# Patient Record
Sex: Female | Born: 1970 | Race: Black or African American | Hispanic: No | Marital: Single | Smoking: Current every day smoker
Health system: Southern US, Community
[De-identification: ages and names within clinical notes are randomized; demographics above are authoritative.]

## PROBLEM LIST (undated history)

## (undated) DIAGNOSIS — I10 Essential (primary) hypertension: Secondary | ICD-10-CM

## (undated) HISTORY — PX: TUBAL LIGATION: SHX77

## (undated) HISTORY — PX: KNEE SURGERY: SHX244

---

## 2006-08-14 ENCOUNTER — Emergency Department (HOSPITAL_COMMUNITY): Admission: EM | Admit: 2006-08-14 | Discharge: 2006-08-14 | Payer: Self-pay | Admitting: Emergency Medicine

## 2009-11-27 ENCOUNTER — Emergency Department (HOSPITAL_BASED_OUTPATIENT_CLINIC_OR_DEPARTMENT_OTHER): Admission: EM | Admit: 2009-11-27 | Discharge: 2009-11-27 | Payer: Self-pay | Admitting: Emergency Medicine

## 2009-11-27 ENCOUNTER — Ambulatory Visit: Payer: Self-pay | Admitting: Interventional Radiology

## 2010-11-02 ENCOUNTER — Emergency Department (HOSPITAL_BASED_OUTPATIENT_CLINIC_OR_DEPARTMENT_OTHER)
Admission: EM | Admit: 2010-11-02 | Discharge: 2010-11-02 | Disposition: A | Discharge status: Home / Self Care | Payer: Medicaid Other | Attending: Emergency Medicine | Admitting: Emergency Medicine

## 2010-11-02 DIAGNOSIS — H00019 Hordeolum externum unspecified eye, unspecified eyelid: Secondary | ICD-10-CM | POA: Insufficient documentation

## 2010-11-02 DIAGNOSIS — H5710 Ocular pain, unspecified eye: Secondary | ICD-10-CM | POA: Insufficient documentation

## 2010-11-02 DIAGNOSIS — H5789 Other specified disorders of eye and adnexa: Secondary | ICD-10-CM | POA: Insufficient documentation

## 2010-11-02 DIAGNOSIS — L03211 Cellulitis of face: Secondary | ICD-10-CM | POA: Insufficient documentation

## 2010-11-02 DIAGNOSIS — L0201 Cutaneous abscess of face: Secondary | ICD-10-CM | POA: Insufficient documentation

## 2010-12-04 LAB — PREGNANCY, URINE: Preg Test, Ur: NEGATIVE

## 2011-12-13 ENCOUNTER — Other Ambulatory Visit: Payer: Self-pay

## 2011-12-13 ENCOUNTER — Emergency Department (HOSPITAL_COMMUNITY): Payer: Medicaid Other

## 2011-12-13 ENCOUNTER — Emergency Department (HOSPITAL_COMMUNITY)
Admission: EM | Admit: 2011-12-13 | Discharge: 2011-12-13 | Disposition: A | Discharge status: Home / Self Care | Payer: Medicaid Other | Attending: Emergency Medicine | Admitting: Emergency Medicine

## 2011-12-13 ENCOUNTER — Encounter (HOSPITAL_COMMUNITY): Payer: Self-pay | Admitting: Emergency Medicine

## 2011-12-13 DIAGNOSIS — R059 Cough, unspecified: Secondary | ICD-10-CM | POA: Insufficient documentation

## 2011-12-13 DIAGNOSIS — R0789 Other chest pain: Secondary | ICD-10-CM

## 2011-12-13 DIAGNOSIS — R05 Cough: Secondary | ICD-10-CM | POA: Insufficient documentation

## 2011-12-13 DIAGNOSIS — R071 Chest pain on breathing: Secondary | ICD-10-CM | POA: Insufficient documentation

## 2011-12-13 MED ORDER — PROMETHAZINE-DM 6.25-15 MG/5ML PO SYRP: 5.0000 mL | ORAL_SOLUTION | Freq: Four times a day (QID) | ORAL | Status: AC | PRN

## 2011-12-13 MED ORDER — IBUPROFEN 800 MG PO TABS
800.0000 mg | ORAL_TABLET | Freq: Once | ORAL | Status: AC
  Administered 2011-12-13: 800 mg via ORAL
  Filled 2011-12-13: qty 1

## 2011-12-13 MED ORDER — IBUPROFEN 800 MG PO TABS: 800.0000 mg | ORAL_TABLET | Freq: Once | ORAL | Status: AC

## 2011-12-13 MED ORDER — CYCLOBENZAPRINE HCL 5 MG PO TABS: 5.0000 mg | ORAL_TABLET | Freq: Once | ORAL | Status: AC

## 2011-12-13 MED ORDER — CYCLOBENZAPRINE HCL 10 MG PO TABS
5.0000 mg | ORAL_TABLET | Freq: Once | ORAL | Status: AC
  Administered 2011-12-13: 22:00:00 via ORAL
  Filled 2011-12-13: qty 1

## 2011-12-13 NOTE — ED Notes (Signed)
Lab in for blood draw.

## 2011-12-13 NOTE — ED Notes (Signed)
PT. REPORTS PERSISTENT PRODUCTIVE COUGH WITH RUNNY NOSE/NASAL CONGESTION WITH CHEST CONGESTION , ALSO REPORTS UPPER BACK PAIN AND LATERAL RIBCAGE PAIN WORSE WHEN COUGHING AND DEEP INSPIRATION FOR SEVERAL DAYS .

## 2011-12-13 NOTE — ED Provider Notes (Addendum)
History     CSN: [id number]  Arrival date & time 12/13/11  1908   First MD Initiated Contact with Patient 12/13/11 2157      Chief Complaint  Patient presents with  . Cough    (Consider location/radiation/quality/duration/timing/severity/associated sxs/prior treatment) HPI Comments: Patient with productive cough, runny nose, chest congestion over back pain bilateral rib cage worse with coughing or deep breath.  For the past several days.  She has not taken any medication for relief.  She has not done anything to relieve her running nose.  Nothing makes her symptoms better lying down makes her symptoms worse  The history is provided by the patient.    History reviewed. No pertinent past medical history.  History reviewed. No pertinent past surgical history.  No family history on file.  History  Substance Use Topics  . Smoking status: Never Smoker   . Smokeless tobacco: Not on file  . Alcohol Use: No    OB History    Grav Para Term Preterm Abortions TAB SAB Ect Mult Living                  Review of Systems  Constitutional: Negative for fever and chills.  HENT: Positive for congestion.   Respiratory: Positive for cough. Negative for shortness of breath and wheezing.   Cardiovascular: Positive for chest pain.  Neurological: Negative for dizziness and weakness.    Allergies  Review of patient's allergies indicates no known allergies.  Home Medications   Current Outpatient Rx  Name Route Sig Dispense Refill  . CYCLOBENZAPRINE HCL 5 MG PO TABS Oral Take 1 tablet (5 mg total) by mouth once. 30 tablet 0  . IBUPROFEN 800 MG PO TABS Oral Take 1 tablet (800 mg total) by mouth once. 30 tablet 0  . PROMETHAZINE-DM 6.25-15 MG/5ML PO SYRP Oral Take 5 mLs by mouth 4 (four) times daily as needed for cough. 118 mL 0    BP 108/73  Pulse 57  Temp(Src) 97.9 F (36.6 C) (Oral)  Resp 18  SpO2 100%  LMP 12/10/2011  Physical Exam  Constitutional: She appears well-developed  and well-nourished.  Cardiovascular: Normal rate.   Pulmonary/Chest: Effort normal.  Abdominal: Soft.  Musculoskeletal: Normal range of motion.  Neurological: She is alert.  Skin: Skin is warm.    ED Course  Procedures (including critical care time)   Labs Reviewed  POCT I-STAT TROPONIN I   Dg Chest 2 View  12/13/2011  *RADIOLOGY REPORT*  Clinical Data: Chest pain and shortness of breath.  Previous smoker.  CHEST - 2 VIEW  Comparison: 11/27/2009  Findings: Normal heart size.  Clear lung fields.  No bony abnormality.  The appearance is not significantly changed from 2011.  IMPRESSION: Negative.  Original Report Authenticated By: Elsie Stain, M.D.     1. Cough   2. Chest wall pain    ED ECG REPORT   Date: 12/13/2011  EKG Time: 11:39 PM  Rate65  Rhythm: normal sinus rhythm,  unchanged from previous tracings  Axis: normal  Intervals:normal  ST&T Change: none  Narrative Interpretation:R atrial enlargement              MDM  Chest wall pain due to chronic cough        Arman Filter, NP 12/13/11 4098  Arman Filter, NP 12/13/11 1191  Arman Filter, NP 02/12/12 2105

## 2011-12-13 NOTE — Discharge Instructions (Signed)
Chest Pain (Nonspecific) Chest pain has many causes. Your pain could be caused by something serious, such as a heart attack or a blood clot in the lungs. It could also be caused by something less serious, such as a chest bruise or a virus. Follow up with your doctor. More lab tests or other studies may be needed to find the cause of your pain. Most of the time, nonspecific chest pain will improve within 2 to 3 days of rest and mild pain medicine. HOME CARE  For chest bruises, you may put ice on the sore area for 15 to 20 minutes, 3 to 4 times a day. Do this only if it makes you or your child feel better.   Put ice in a plastic bag.   Place a towel between the skin and the bag.   Rest for the next 2 to 3 days.   Go back to work if the pain improves.   See your doctor if the pain lasts longer than 1 to 2 weeks.   Only take medicine as told by your doctor.   Quit smoking if you smoke.  GET HELP RIGHT AWAY IF:   There is more pain or pain that spreads to the arm, neck, jaw, back, or belly (abdomen).   You or your child has shortness of breath.   You or your child coughs more than usual or coughs up blood.   You or your child has very bad back or belly pain, feels sick to his or her stomach (nauseous), or throws up (vomits).   You or your child has very bad weakness.   You or your child passes out (faints).   You or your child has a temperature by mouth above 102 F (38.9 C), not controlled by medicine.  Any of these problems may be serious and may be an emergency. Do not wait to see if the problems will go away. Get medical help right away. Call your local emergency services 911 in [place] Do not drive yourself to the hospital. MAKE SURE YOU:   Understand these instructions.   Will watch this condition.   Will get help right away if you or your child is not doing well or gets worse.  Document Released: 02/13/2008 Document Revised: 08/16/2011 Document Reviewed:  02/13/2008 [facility] Patient Information 2012 [place], [place].Chest Wall Pain Chest wall pain is pain in or around the bones and muscles of your chest. It may take up to 6 weeks to get better. It may take longer if you must stay physically active in your work and activities.  CAUSES  Chest wall pain may happen on its own. However, it may be caused by:  A viral illness like the flu.   Injury.   Coughing.   Exercise.   Arthritis.   Fibromyalgia.   Shingles.  HOME CARE INSTRUCTIONS   Avoid overtiring physical activity. Try not to strain or perform activities that cause pain. This includes any activities using your chest or your abdominal and side muscles, especially if heavy weights are used.   Put ice on the sore area.   Put ice in a plastic bag.   Place a towel between your skin and the bag.   Leave the ice on for 15 to 20 minutes per hour while awake for the first 2 days.   Only take over-the-counter or prescription medicines for pain, discomfort, or fever as directed by your caregiver.  SEEK IMMEDIATE MEDICAL CARE IF:   Your pain increases,  or you are very uncomfortable.   You have a fever.   Your chest pain becomes worse.   You have new, unexplained symptoms.   You have nausea or vomiting.   You feel sweaty or lightheaded.   You have a cough with phlegm (sputum), or you cough up blood.  MAKE SURE YOU:   Understand these instructions.   Will watch your condition.   Will get help right away if you are not doing well or get worse.  Document Released: 08/27/2005 Document Revised: 08/16/2011 Document Reviewed: 04/23/2011 [facility] Patient Information 2012 [place], [place].Chest Wall Pain Chest wall pain is pain in or around the bones and muscles of your chest. It may take up to 6 weeks to get better. It may take longer if you must stay physically active in your work and activities.  CAUSES  Chest wall pain may happen on its own. However, it may be caused by:  A  viral illness like the flu.   Injury.   Coughing.   Exercise.   Arthritis.   Fibromyalgia.   Shingles.  HOME CARE INSTRUCTIONS   Avoid overtiring physical activity. Try not to strain or perform activities that cause pain. This includes any activities using your chest or your abdominal and side muscles, especially if heavy weights are used.   Put ice on the sore area.   Put ice in a plastic bag.   Place a towel between your skin and the bag.   Leave the ice on for 15 to 20 minutes per hour while awake for the first 2 days.   Only take over-the-counter or prescription medicines for pain, discomfort, or fever as directed by your caregiver.  SEEK IMMEDIATE MEDICAL CARE IF:   Your pain increases, or you are very uncomfortable.   You have a fever.   Your chest pain becomes worse.   You have new, unexplained symptoms.   You have nausea or vomiting.   You feel sweaty or lightheaded.   You have a cough with phlegm (sputum), or you cough up blood.  MAKE SURE YOU:   Understand these instructions.   Will watch your condition.   Will get help right away if you are not doing well or get worse.  Document Released: 08/27/2005 Document Revised: 08/16/2011 Document Reviewed: 04/23/2011 [facility] Patient Information 2012 [place], [place]. RESOURCE GUIDE  Dental Problems  Patients with Medicaid: [facility] [phone]                                           [phone] Phone:  [phone]                                                  Phone:  [phone]  If unable to pay or uninsured, contact:  [facility] or [facility] to become qualified for the adult dental clinic.  Chronic Pain Problems Contact [facility]  [phone] Patients need to be referred by their primary care doctor.  Insufficient Money for Medicine Contact [employer] Way:  call "211" or [facility] [employer]  [phone].  No Primary Care Doctor Call [facility]  [phone] Other agencies that provide inexpensive medical care    [facility]  [phone]    [facility]  [phone]    [facility]  [phone]    [facility]  [phone]    [facility]  [phone]    [facility]  [phone]  Psychological Services [facility]  [phone] [facility]  [phone] [facility]   [phone] (emergency services [phone])  Substance Abuse Resources [facility]  [phone] [facility] [phone] [facility] [phone] Floydene Flock [phone] Residential & Outpatient Substance Abuse Program  [phone]  Abuse/Neglect [facility] Child Abuse Hotline 539-530-7616 [facility] Child Abuse Hotline 5817193654 (After Hours)  Emergency Shelter [facility] [phone]  [facility] [facility] 7095963059 [facility] [phone]  MRSA Hotline #:   586-516-9902    [facility] Resources  [facility] [place]     [employer]                          [facility] Dept. [address] [place]                       [address]      [address]  Blondell Reveal Phone:  [phone]                                   Phone:  [phone]                 Phone:  [phone]  [facility] Phone:  [phone]  [facility] Child Abuse Hotline 279-047-0818 [phone] (After Hours)

## 2011-12-13 NOTE — ED Notes (Signed)
Pt denies any questions or pain upon discharge. 

## 2011-12-14 NOTE — ED Provider Notes (Signed)
Medical screening examination/treatment/procedure(s) were performed by non-physician practitioner and as supervising physician I was immediately available for consultation/collaboration.   Lorece Keach, MD 12/14/11 0036 

## 2012-02-13 NOTE — ED Provider Notes (Signed)
Medical screening examination/treatment/procedure(s) were performed by non-physician practitioner and as supervising physician I was immediately available for consultation/collaboration.   Caeleb Batalla, MD 02/13/12 2231 

## 2012-02-21 ENCOUNTER — Encounter (HOSPITAL_BASED_OUTPATIENT_CLINIC_OR_DEPARTMENT_OTHER): Payer: Self-pay | Admitting: Student

## 2012-02-21 ENCOUNTER — Emergency Department (HOSPITAL_BASED_OUTPATIENT_CLINIC_OR_DEPARTMENT_OTHER)
Admission: EM | Admit: 2012-02-21 | Discharge: 2012-02-21 | Disposition: A | Discharge status: Home / Self Care | Payer: Medicaid Other | Attending: Emergency Medicine | Admitting: Emergency Medicine

## 2012-02-21 DIAGNOSIS — J029 Acute pharyngitis, unspecified: Secondary | ICD-10-CM

## 2012-02-21 DIAGNOSIS — R070 Pain in throat: Secondary | ICD-10-CM | POA: Insufficient documentation

## 2012-02-21 LAB — RAPID STREP SCREEN (MED CTR MEBANE ONLY): Streptococcus, Group A Screen (Direct): NEGATIVE

## 2012-02-21 NOTE — ED Notes (Signed)
Sore throat x 3-4 days, painful swallowing

## 2012-02-21 NOTE — ED Provider Notes (Signed)
History     CSN: [id number]  Arrival date & time 02/21/12  1251   First MD Initiated Contact with Patient 02/21/12 1303      Chief Complaint  Patient presents with  . Sore Throat    (Consider location/radiation/quality/duration/timing/severity/associated sxs/prior treatment) HPI  Patient with sore throat for 3-4 days. She describes pain with swallowing. She denies any swelling. She has not had a fever with this. She has had some cough and is coughing up some yellow sputum. She denies any shortness of breath. She denies any sick contacts. She quit smoking in October. History reviewed. No pertinent past medical history.  Past Surgical History  Procedure Date  . Knee surgery     Left Knee    History reviewed. No pertinent family history.  History  Substance Use Topics  . Smoking status: Never Smoker   . Smokeless tobacco: Not on file  . Alcohol Use: No    OB History    Grav Para Term Preterm Abortions TAB SAB Ect Mult Living                  Review of Systems  All other systems reviewed and are negative.    Allergies  Review of patient's allergies indicates no known allergies.  Home Medications  No current outpatient prescriptions on file.  BP 123/80  Pulse 72  Temp 98 F (36.7 C) (Oral)  Resp 20  Wt 187 lb (84.823 kg)  SpO2 99%  LMP 02/08/2012  Physical Exam  Nursing note and vitals reviewed. Constitutional: She appears well-developed and well-nourished.  HENT:  Head: Normocephalic and atraumatic.  Eyes: Conjunctivae and EOM are normal. Pupils are equal, round, and reactive to light.  Neck: Normal range of motion. Neck supple.  Cardiovascular: Normal rate, regular rhythm, normal heart sounds and intact distal pulses.   Pulmonary/Chest: Effort normal and breath sounds normal.  Abdominal: Soft. Bowel sounds are normal.  Musculoskeletal: Normal range of motion.  Neurological: She is alert.  Skin: Skin is warm and dry.  Psychiatric: She has a normal  mood and affect. Thought content normal.    ED Course  Procedures (including critical care time)   Labs Reviewed  RAPID STREP SCREEN   No results found.   No diagnosis found.    MDM         Hilario Quarry, MD 02/22/12 [phone]

## 2012-02-21 NOTE — Discharge Instructions (Signed)
Antibiotic Nonuse  Your caregiver felt that the infection or problem was not one that would be helped with an antibiotic. Infections may be caused by viruses or bacteria. Only a caregiver can tell which one of these is the likely cause of an illness. A cold is the most common cause of infection in both adults and children. A cold is a virus. Antibiotic treatment will have no effect on a viral infection. Viruses can lead to many lost days of work caring for sick children and many missed days of school. Children may catch as many as 10 "colds" or "flus" per year during which they can be tearful, cranky, and uncomfortable. The goal of treating a virus is aimed at keeping the ill person comfortable. Antibiotics are medications used to help the body fight bacterial infections. There are relatively few types of bacteria that cause infections but there are hundreds of viruses. While both viruses and bacteria cause infection they are very different types of germs. A viral infection will typically go away by itself within 7 to 10 days. Bacterial infections may spread or get worse without antibiotic treatment. Examples of bacterial infections are:  Sore throats (like strep throat or tonsillitis).   Infection in the lung (pneumonia).   Ear and skin infections.  Examples of viral infections are:  Colds or flus.   Most coughs and bronchitis.   Sore throats not caused by Strep.   Runny noses.  It is often best not to take an antibiotic when a viral infection is the cause of the problem. Antibiotics can kill off the helpful bacteria that we have inside our body and allow harmful bacteria to start growing. Antibiotics can cause side effects such as allergies, nausea, and diarrhea without helping to improve the symptoms of the viral infection. Additionally, repeated uses of antibiotics can cause bacteria inside of our body to become resistant. That resistance can be passed onto harmful bacterial. The next time  you have an infection it may be harder to treat if antibiotics are used when they are not needed. Not treating with antibiotics allows our own immune system to develop and take care of infections more efficiently. Also, antibiotics will work better for [place] when they are prescribed for bacterial infections. Treatments for a child that is ill may include:  Give extra fluids throughout the day to stay hydrated.   Get plenty of rest.   Only give your child over-the-counter or prescription medicines for pain, discomfort, or fever as directed by your caregiver.   The use of a cool mist humidifier may help stuffy noses.   Cold medications if suggested by your caregiver.  Your caregiver may decide to start you on an antibiotic if:  The problem you were seen for today continues for a longer length of time than expected.   You develop a secondary bacterial infection.  SEEK MEDICAL CARE IF:  Fever lasts longer than 5 days.   Symptoms continue to get worse after 5 to 7 days or become severe.   Difficulty in breathing develops.   Signs of dehydration develop (poor drinking, rare urinating, dark colored urine).   Changes in behavior or worsening tiredness (listlessness or lethargy).  Document Released: 11/05/2001 Document Revised: 08/16/2011 Document Reviewed: 05/04/2009 [facility] Patient Information 2012 [place], [place].Sore Throat Sore throats may be caused by bacteria and viruses. They may also be caused by:  Smoking.   Pollution.   Allergies.  If a sore throat is due to strep infection (a bacterial infection),  you may need:  A throat swab.   A culture test to verify the strep infection.  You will need one of these:  An antibiotic shot.   Oral medicine for a full 10 days.  Strep infection is very contagious. A doctor should check any close contacts who have a sore throat or fever. A sore throat caused by a virus infection will usually last only 3-4 days. Antibiotics will not treat a  viral sore throat.  Infectious mononucleosis (a viral disease), however, can cause a sore throat that lasts for up to 3 weeks. Mononucleosis can be diagnosed with blood tests. You must have been sick for at least 1 week in order for the test to give accurate results. HOME CARE INSTRUCTIONS   To treat a sore throat, take mild pain medicine.   Increase your fluids.   Eat a soft diet.   Do not smoke.   Gargling with warm water or salt water (1 tsp. salt in 8 oz. water) can be helpful.   Try throat sprays or lozenges or sucking on hard candy to ease the symptoms.  Call your doctor if your sore throat lasts longer than 1 week.  SEEK IMMEDIATE MEDICAL CARE IF:  You have difficulty breathing.   You have increased swelling in the throat.   You have pain so severe that you are unable to swallow fluids or your saliva.   You have a severe headache, a high fever, vomiting, or a red rash.  Document Released: 10/04/2004 Document Revised: 08/16/2011 Document Reviewed: 08/14/2007 [facility] Patient Information 2012 [place], [place].Sore Throat Sore throats may be caused by bacteria and viruses. They may also be caused by:  Smoking.   Pollution.   Allergies.  If a sore throat is due to strep infection (a bacterial infection), you may need:  A throat swab.   A culture test to verify the strep infection.  You will need one of these:  An antibiotic shot.   Oral medicine for a full 10 days.  Strep infection is very contagious. A doctor should check any close contacts who have a sore throat or fever. A sore throat caused by a virus infection will usually last only 3-4 days. Antibiotics will not treat a viral sore throat.  Infectious mononucleosis (a viral disease), however, can cause a sore throat that lasts for up to 3 weeks. Mononucleosis can be diagnosed with blood tests. You must have been sick for at least 1 week in order for the test to give accurate results. HOME CARE INSTRUCTIONS   To  treat a sore throat, take mild pain medicine.   Increase your fluids.   Eat a soft diet.   Do not smoke.   Gargling with warm water or salt water (1 tsp. salt in 8 oz. water) can be helpful.   Try throat sprays or lozenges or sucking on hard candy to ease the symptoms.  Call your doctor if your sore throat lasts longer than 1 week.  SEEK IMMEDIATE MEDICAL CARE IF:  You have difficulty breathing.   You have increased swelling in the throat.   You have pain so severe that you are unable to swallow fluids or your saliva.   You have a severe headache, a high fever, vomiting, or a red rash.  Document Released: 10/04/2004 Document Revised: 08/16/2011 Document Reviewed: 08/14/2007 [facility] Patient Information 2012 [place], [place].

## 2012-02-26 ENCOUNTER — Emergency Department (HOSPITAL_BASED_OUTPATIENT_CLINIC_OR_DEPARTMENT_OTHER)
Admission: EM | Admit: 2012-02-26 | Discharge: 2012-02-26 | Disposition: A | Discharge status: Home / Self Care | Payer: Medicaid Other | Attending: Emergency Medicine | Admitting: Emergency Medicine

## 2012-02-26 ENCOUNTER — Encounter (HOSPITAL_BASED_OUTPATIENT_CLINIC_OR_DEPARTMENT_OTHER): Payer: Self-pay | Admitting: *Deleted

## 2012-02-26 DIAGNOSIS — R05 Cough: Secondary | ICD-10-CM

## 2012-02-26 DIAGNOSIS — J069 Acute upper respiratory infection, unspecified: Secondary | ICD-10-CM

## 2012-02-26 MED ORDER — AZITHROMYCIN 250 MG PO TABS: 250.0000 mg | ORAL_TABLET | Freq: Every day | ORAL | Status: AC

## 2012-02-26 MED ORDER — HYDROCODONE-ACETAMINOPHEN 7.5-500 MG/15ML PO SOLN: 15.0000 mL | Freq: Four times a day (QID) | ORAL | Status: AC | PRN

## 2012-02-26 NOTE — ED Provider Notes (Signed)
History     CSN: [id number]  Arrival date & time 02/26/12  2017   First MD Initiated Contact with Patient 02/26/12 2113      Chief Complaint  Patient presents with  . Sore Throat    (Consider location/radiation/quality/duration/timing/severity/associated sxs/prior treatment) Patient is a 41 y.o. female presenting with URI. The history is provided by the patient.  URI The primary symptoms include sore throat and cough. Primary symptoms do not include fever, wheezing, nausea, vomiting or rash. Episode onset: 2 weeks ago. This is a new problem. The problem has not changed since onset. Associated with: none. Symptoms associated with the illness include congestion. The illness is not associated with chills, facial pain or sinus pressure. The following treatments were addressed: A decongestant was ineffective. NSAIDs were ineffective.    History reviewed. No pertinent past medical history.  Past Surgical History  Procedure Date  . Knee surgery     Left Knee    History reviewed. No pertinent family history.  History  Substance Use Topics  . Smoking status: Never Smoker   . Smokeless tobacco: Not on file  . Alcohol Use: No    OB History    Grav Para Term Preterm Abortions TAB SAB Ect Mult Living                  Review of Systems  Constitutional: Negative for fever and chills.  HENT: Positive for congestion and sore throat. Negative for sinus pressure.   Respiratory: Positive for cough. Negative for wheezing.   Gastrointestinal: Negative for nausea and vomiting.  Skin: Negative for rash.  All other systems reviewed and are negative.    Allergies  Review of patient's allergies indicates no known allergies.  Home Medications   Current Outpatient Rx  Name Route Sig Dispense Refill  . ALKA-SELTZER PLUS COLD PO Oral Take 2 tablets by mouth daily as needed. Patient used this medication for cold symptoms.    . IBUPROFEN 200 MG PO TABS Oral Take 400 mg by mouth every 6  (six) hours as needed. Patient used this medication for pain.      BP 120/74  Pulse 87  Temp 98.4 F (36.9 C) (Oral)  Resp 18  Ht 5\' 3"  (1.6 m)  Wt 187 lb (84.823 kg)  BMI 33.13 kg/m2  SpO2 100%  LMP 02/08/2012  Physical Exam  Nursing note and vitals reviewed. Constitutional: She is oriented to person, place, and time. She appears well-developed and well-nourished. She appears distressed.  HENT:  Head: Normocephalic and atraumatic.  Right Ear: Tympanic membrane and ear canal normal.  Left Ear: Tympanic membrane and ear canal normal.  Nose: Mucosal edema and rhinorrhea present.  Mouth/Throat: Mucous membranes are normal. Posterior oropharyngeal erythema present. No oropharyngeal exudate or posterior oropharyngeal edema.       Hoarse voice  Eyes: EOM are normal. Pupils are equal, round, and reactive to light.  Cardiovascular: Normal rate, regular rhythm, normal heart sounds and intact distal pulses.  Exam reveals no friction rub.   No murmur heard. Pulmonary/Chest: Effort normal and breath sounds normal. She has no wheezes. She has no rales.  Abdominal: Soft. Bowel sounds are normal. She exhibits no distension. There is no tenderness. There is no rebound and no guarding.  Musculoskeletal: Normal range of motion. She exhibits no tenderness.       No edema  Lymphadenopathy:    She has no cervical adenopathy.  Neurological: She is alert and oriented to person, place, and time. No  cranial nerve deficit.  Skin: Skin is warm and dry. No rash noted.  Psychiatric: She has a normal mood and affect. Her behavior is normal.    ED Course  Procedures (including critical care time)  Labs Reviewed - No data to display No results found.   1. URI (upper respiratory infection)   2. Cough       MDM   Patient with persistent sore throat and cough now for over a week and a half. She was seen here 5 days ago and had a negative strep swab. She states she's had continued sore throat and  cough. She denies any fever and has no symptoms concerning for epiglottitis, RPA or peritonsillar abscess.  Feel this is most likely sinus related. We'll try a course of azithromycin and something for her pain. She is breathing normally and has history of asthma or wheezing on exam.       Gwyneth Sprout, MD 02/27/12 0021

## 2012-02-26 NOTE — ED Notes (Signed)
Sen hereon 6/13 for same, URI symptoms cont

## 2012-02-26 NOTE — Discharge Instructions (Signed)
Cough, Adult  A cough is a reflex. It helps you clear your throat and airways. A cough can help heal your body. A cough can last 2 or 3 weeks (acute) or may last more than 8 weeks (chronic). Some common causes of a cough can include an infection, allergy, or a cold. HOME CARE  Only take medicine as told by your doctor.   If given, take your medicines (antibiotics) as told. Finish them even if you start to feel better.   Use a cold steam vaporizer or humidier in your home. This can help loosen thick spit (secretions).   Sleep so you are almost sitting up (semi-upright). Use pillows to do this. This helps reduce coughing.   Rest as needed.   Stop smoking if you smoke.  GET HELP RIGHT AWAY IF:  You have yellowish-white fluid (pus) in your thick spit.   Your cough gets worse.   Your medicine does not reduce coughing, and you are losing sleep.   You cough up blood.   You have trouble breathing.   Your pain gets worse and medicine does not help.   You have a fever.  MAKE SURE YOU:   Understand these instructions.   Will watch your condition.   Will get help right away if you are not doing well or get worse.  Document Released: 05/10/2011 Document Revised: 08/16/2011 Document Reviewed: 05/10/2011 [facility] Patient Information 2012 [place], [place].  Insufficient Money for Medicine Contact [employer]:  call "211" or [facility] [phone].  No Primary Care Doctor Call [facility]  [phone] Other agencies that provide inexpensive medical care    [facility]  [phone]    [facility]  [phone]    [facility]  [phone]    [facility]  [phone]    [facility]  [phone]    [facility]  [phone]

## 2013-06-14 ENCOUNTER — Emergency Department (HOSPITAL_BASED_OUTPATIENT_CLINIC_OR_DEPARTMENT_OTHER)
Admission: EM | Admit: 2013-06-14 | Discharge: 2013-06-14 | Disposition: A | Discharge status: Home / Self Care | Payer: Medicaid Other | Attending: Emergency Medicine | Admitting: Emergency Medicine

## 2013-06-14 ENCOUNTER — Encounter (HOSPITAL_BASED_OUTPATIENT_CLINIC_OR_DEPARTMENT_OTHER): Payer: Self-pay | Admitting: Emergency Medicine

## 2013-06-14 DIAGNOSIS — Z3202 Encounter for pregnancy test, result negative: Secondary | ICD-10-CM | POA: Insufficient documentation

## 2013-06-14 DIAGNOSIS — R1013 Epigastric pain: Secondary | ICD-10-CM | POA: Insufficient documentation

## 2013-06-14 DIAGNOSIS — Z79899 Other long term (current) drug therapy: Secondary | ICD-10-CM | POA: Insufficient documentation

## 2013-06-14 DIAGNOSIS — F17200 Nicotine dependence, unspecified, uncomplicated: Secondary | ICD-10-CM | POA: Insufficient documentation

## 2013-06-14 DIAGNOSIS — R1110 Vomiting, unspecified: Secondary | ICD-10-CM | POA: Insufficient documentation

## 2013-06-14 DIAGNOSIS — M549 Dorsalgia, unspecified: Secondary | ICD-10-CM | POA: Insufficient documentation

## 2013-06-14 LAB — COMPREHENSIVE METABOLIC PANEL
Calcium: 9.5 mg/dL (ref 8.4–10.5)
Chloride: 104 mEq/L (ref 96–112)
GFR calc Af Amer: >90 mL/min (ref >90)
Glucose, Bld: 91 mg/dL (ref 70–99)
Sodium: 139 mEq/L (ref 135–145)
Total Bilirubin: <0.1 mg/dL — ABNORMAL LOW (ref 0.3–1.2)
Total Protein: 7 g/dL (ref 6.0–8.3)

## 2013-06-14 LAB — URINALYSIS, ROUTINE W REFLEX MICROSCOPIC
Bilirubin Urine: NEGATIVE
Ketones, ur: NEGATIVE mg/dL
Specific Gravity, Urine: 1.031 — ABNORMAL HIGH (ref 1.005–1.030)
Urobilinogen, UA: 0.2 mg/dL (ref 0.0–1.0)

## 2013-06-14 LAB — CBC
MCH: 29.6 pg (ref 26.0–34.0)
Platelets: 336 10*3/uL (ref 150–400)
RBC: 3.92 MIL/uL (ref 3.87–5.11)
WBC: 6.2 10*3/uL (ref 4.0–10.5)

## 2013-06-14 LAB — PREGNANCY, URINE: Preg Test, Ur: NEGATIVE

## 2013-06-14 MED ORDER — SODIUM CHLORIDE 0.9 % IV BOLUS (SEPSIS)
1000.0000 mL | Freq: Once | INTRAVENOUS | Status: AC
  Administered 2013-06-14: 1000 mL via INTRAVENOUS

## 2013-06-14 MED ORDER — RANITIDINE HCL 150 MG PO TABS: 150.0000 mg | ORAL_TABLET | Freq: Two times a day (BID) | ORAL | Status: DC

## 2013-06-14 MED ORDER — ONDANSETRON HCL 4 MG/2ML IJ SOLN
4.0000 mg | Freq: Once | INTRAMUSCULAR | Status: AC
  Administered 2013-06-14: 4 mg via INTRAVENOUS
  Filled 2013-06-14: qty 2

## 2013-06-14 MED ORDER — MORPHINE SULFATE 4 MG/ML IJ SOLN
4.0000 mg | Freq: Once | INTRAMUSCULAR | Status: AC
  Administered 2013-06-14: 4 mg via INTRAVENOUS
  Filled 2013-06-14: qty 1

## 2013-06-14 NOTE — ED Provider Notes (Signed)
CSN: [id number]     Arrival date & time 06/14/13  0137 History   First MD Initiated Contact with Patient 06/14/13 0205     Chief Complaint  Patient presents with  . Abdominal Pain  . Back Pain   (Consider location/radiation/quality/duration/timing/severity/associated sxs/prior Treatment) HPI Pt presents with c/o upper abdominal pain- pain is in middle and radiates to both sides.  Also c/o upper back pain.  She states symptoms have been intermittent over the past several months, but worse over the past 3 days.  No relationship to food.  No fever/chills, no change in stools.  She states she vomited one time today with the pain.  No cough, chest pain or shortness of breath.  She has not had any treatment prior to arrival.  There are no other associated systemic symptoms, there are no other alleviating or modifying factors.   History reviewed. No pertinent past medical history. Past Surgical History  Procedure Laterality Date  . Knee surgery      Left Knee   No family history on file. History  Substance Use Topics  . Smoking status: Current Every Day Smoker -- 0.50 packs/day for 10 years    Types: Cigarettes  . Smokeless tobacco: Not on file  . Alcohol Use: No   OB History   Grav Para Term Preterm Abortions TAB SAB Ect Mult Living                 Review of Systems ROS reviewed and all otherwise negative except for mentioned in HPI  Allergies  Review of patient's allergies indicates no known allergies.  Home Medications   Current Outpatient Rx  Name  Route  Sig  Dispense  Refill  . Chlorphen-Phenyleph-ASA (ALKA-SELTZER PLUS COLD PO)   Oral   Take 2 tablets by mouth daily as needed. Patient used this medication for cold symptoms.         Marland Kitchen ibuprofen (ADVIL,MOTRIN) 200 MG tablet   Oral   Take 400 mg by mouth every 6 (six) hours as needed. Patient used this medication for pain.         . ranitidine (ZANTAC) 150 MG tablet   Oral   Take 1 tablet (150 mg total) by mouth 2  (two) times daily.   60 tablet   0    BP 136/83  Pulse 54  Temp(Src) 97.8 F (36.6 C) (Oral)  Resp 16  Ht 5\' 3"  (1.6 m)  Wt 165 lb (74.844 kg)  BMI 29.24 kg/m2  SpO2 100%  LMP 06/14/2013 Vitals reviewed Physical Exam  Physical Examination: General appearance - alert, well appearing, and in no distress Mental status - alert, oriented to person, place, and time Eyes - no conjunctival injection, no scleral icterus Mouth - mucous membranes moist, pharynx normal without lesions Chest - clear to auscultation, no wheezes, rales or rhonchi, symmetric air entry Heart - normal rate, regular rhythm, normal S1, S2, no murmurs, rubs, clicks or gallops Abdomen - soft, nontender, nondistended, no masses or organomegaly, nabs Back- no CVA tenderness Extremities - peripheral pulses normal, no pedal edema, no clubbing or cyanosis Skin - normal coloration and turgor, no rashes  ED Course  Procedures (including critical care time) Labs Review Labs Reviewed  URINALYSIS, ROUTINE W REFLEX MICROSCOPIC - Abnormal; Notable for the following:    Specific Gravity, Urine 1.031 (*)    Leukocytes, UA SMALL (*)    All other components within normal limits  CBC - Abnormal; Notable for the following:  Hemoglobin 11.6 (*)    HCT 34.3 (*)    All other components within normal limits  COMPREHENSIVE METABOLIC PANEL - Abnormal; Notable for the following:    Total Bilirubin <0.1 (*)    All other components within normal limits  URINE MICROSCOPIC-ADD ON - Abnormal; Notable for the following:    Squamous Epithelial / LPF FEW (*)    Bacteria, UA FEW (*)    All other components within normal limits  URINE CULTURE  PREGNANCY, URINE  LIPASE, BLOOD   Imaging Review No results found.  MDM   1. Epigastric pain    Pt presenting with eigastric pain radiating to both side of abdomen- nontender to palpation.  Labs reassuring.  Pt describes a burning type of pain.  Discussed with patient that we will start an  acid reducing medication and she will need to f/u as an outpatient for further testing if symptoms continue.  No indication for imaging today in the ED.  Discharged with strict return precautions.  Pt agreeable with plan.    Ethelda Chick, MD 06/14/13 [phone]

## 2013-06-14 NOTE — ED Notes (Signed)
Pt presents to Ed for upper abdominal pain and back pain x2 days, denies any urinary symptoms. Pt reports back pain that isn't tender to touch, "it just feels more internal" that woke her up last night and pt started vomiting, pt reports small amount of blood in the vomit. Pt states she is on her menstrual cycle at the present time. NAD noted.

## 2013-06-16 LAB — URINE CULTURE: Colony Count: NO GROWTH

## 2013-07-06 ENCOUNTER — Encounter (HOSPITAL_BASED_OUTPATIENT_CLINIC_OR_DEPARTMENT_OTHER): Payer: Self-pay | Admitting: Emergency Medicine

## 2013-07-06 ENCOUNTER — Emergency Department (HOSPITAL_BASED_OUTPATIENT_CLINIC_OR_DEPARTMENT_OTHER)
Admission: EM | Admit: 2013-07-06 | Discharge: 2013-07-06 | Disposition: A | Discharge status: Home / Self Care | Payer: Medicaid Other | Attending: Emergency Medicine | Admitting: Emergency Medicine

## 2013-07-06 ENCOUNTER — Emergency Department (HOSPITAL_BASED_OUTPATIENT_CLINIC_OR_DEPARTMENT_OTHER): Payer: Medicaid Other

## 2013-07-06 DIAGNOSIS — R071 Chest pain on breathing: Secondary | ICD-10-CM | POA: Insufficient documentation

## 2013-07-06 DIAGNOSIS — R059 Cough, unspecified: Secondary | ICD-10-CM | POA: Insufficient documentation

## 2013-07-06 DIAGNOSIS — F17200 Nicotine dependence, unspecified, uncomplicated: Secondary | ICD-10-CM | POA: Insufficient documentation

## 2013-07-06 DIAGNOSIS — Z79899 Other long term (current) drug therapy: Secondary | ICD-10-CM | POA: Insufficient documentation

## 2013-07-06 DIAGNOSIS — M79609 Pain in unspecified limb: Secondary | ICD-10-CM | POA: Insufficient documentation

## 2013-07-06 DIAGNOSIS — R05 Cough: Secondary | ICD-10-CM | POA: Insufficient documentation

## 2013-07-06 DIAGNOSIS — R0789 Other chest pain: Secondary | ICD-10-CM

## 2013-07-06 LAB — CBC WITH DIFFERENTIAL/PLATELET
Basophils Absolute: 0 10*3/uL (ref 0.0–0.1)
Eosinophils Relative: 2 % (ref 0–5)
Lymphocytes Relative: 58 % — ABNORMAL HIGH (ref 12–46)
Lymphs Abs: 3.6 10*3/uL (ref 0.7–4.0)
Neutro Abs: 1.9 10*3/uL (ref 1.7–7.7)
Neutrophils Relative %: 30 % — ABNORMAL LOW (ref 43–77)
Platelets: 333 10*3/uL (ref 150–400)
RBC: 3.78 MIL/uL — ABNORMAL LOW (ref 3.87–5.11)
RDW: 12.2 % (ref 11.5–15.5)
WBC: 6.3 10*3/uL (ref 4.0–10.5)

## 2013-07-06 LAB — BASIC METABOLIC PANEL
CO2: 24 mEq/L (ref 19–32)
Calcium: 9.4 mg/dL (ref 8.4–10.5)
Chloride: 102 mEq/L (ref 96–112)
Creatinine, Ser: 0.7 mg/dL (ref 0.50–1.10)
GFR calc Af Amer: >90 mL/min (ref >90)
GFR calc non Af Amer: >90 mL/min (ref >90)
Potassium: 3.9 mEq/L (ref 3.5–5.1)

## 2013-07-06 LAB — TROPONIN I: Troponin I: <0.3 ng/mL (ref <0.30)

## 2013-07-06 MED ORDER — IBUPROFEN 800 MG PO TABS
800.0000 mg | ORAL_TABLET | Freq: Once | ORAL | Status: AC
  Administered 2013-07-06: 800 mg via ORAL
  Filled 2013-07-06: qty 1

## 2013-07-06 MED ORDER — OXYCODONE-ACETAMINOPHEN 5-325 MG PO TABS
1.0000 | ORAL_TABLET | Freq: Once | ORAL | Status: AC
  Administered 2013-07-06: 1 via ORAL
  Filled 2013-07-06: qty 1

## 2013-07-06 NOTE — ED Notes (Signed)
Cough started yesterday. Today she has had burning pain followed by tightness in her upper chest, heaviness in her left arm and sharp intermittent pain in her left foot.

## 2013-07-06 NOTE — ED Provider Notes (Signed)
CSN: [id number]     Arrival date & time 07/06/13  1709 History   First MD Initiated Contact with Patient 07/06/13 1823     This chart was scribed for Ethelda Chick, MD by Arlan Organ, ED Scribe. This patient was seen in room MH10/MH10 and the patient's care was started 6:41 PM.   Chief Complaint  Patient presents with  . Chest Pain    Patient is a 42 y.o. female presenting with chest pain. The history is provided by the patient. No language interpreter was used.  Chest Pain Pain quality: sharp   Pain radiates to:  L arm Pain radiates to the back: yes   Pain severity:  Mild Onset quality:  Sudden Duration:  1 day Timing:  Constant Progression:  Unchanged Chronicity:  New Relieved by:  None tried Worsened by:  Nothing tried Ineffective treatments:  None tried Associated symptoms: cough   Cough:    Severity:  Mild   Duration:  1 day   Timing:  Intermittent   Progression:  Unchanged  HPI Comments:   Sherry Clark is a 42 y.o  female who presents to the Emergency Department complaining of CP that started today. Pt describes the pain as sharp, followed by tightness and heaviness in her upper chest, with radiating pain to her left arm. Pt also reports intermittent left foot pain and a cough. Pt denies any heavy lifting or exertion. She denies hx of CP, or any similar symptoms in the past. Pt is a smoker. Pt denies any BC use. Pain has been constant for over 6 hours since it began.    History reviewed. No pertinent past medical history. Past Surgical History  Procedure Laterality Date  . Knee surgery      Left Knee   No family history on file. History  Substance Use Topics  . Smoking status: Current Every Day Smoker -- 0.50 packs/day for 10 years    Types: Cigarettes  . Smokeless tobacco: Not on file  . Alcohol Use: No   OB History   Grav Para Term Preterm Abortions TAB SAB Ect Mult Living                 Review of Systems  Respiratory: Positive for cough.    Cardiovascular: Positive for chest pain.  All other systems reviewed and are negative.    Allergies  Review of patient's allergies indicates no known allergies.  Home Medications   Current Outpatient Rx  Name  Route  Sig  Dispense  Refill  . Chlorphen-Phenyleph-ASA (ALKA-SELTZER PLUS COLD PO)   Oral   Take 2 tablets by mouth daily as needed. Patient used this medication for cold symptoms.         Marland Kitchen ibuprofen (ADVIL,MOTRIN) 200 MG tablet   Oral   Take 400 mg by mouth every 6 (six) hours as needed. Patient used this medication for pain.         . ranitidine (ZANTAC) 150 MG tablet   Oral   Take 1 tablet (150 mg total) by mouth 2 (two) times daily.   60 tablet   0    BP 114/73  Pulse 75  Temp(Src) 98.2 F (36.8 C) (Oral)  Resp 20  Ht 5\' 3"  (1.6 m)  Wt 160 lb (72.576 kg)  BMI 28.35 kg/m2  SpO2 100%  LMP 06/14/2013  Physical Exam  Nursing note and vitals reviewed. Constitutional: She is oriented to person, place, and time. She appears well-developed and well-nourished.  HENT:  Head: Normocephalic and atraumatic.  Eyes: EOM are normal.  Neck: Normal range of motion.  Cardiovascular: Normal rate, regular rhythm and normal heart sounds.   Pulmonary/Chest: Effort normal and breath sounds normal. She exhibits tenderness.  Mild tenderness to palpation of left anterior chest wall  Musculoskeletal: Normal range of motion.  No swelling of lower extemities    Neurological: She is alert and oriented to person, place, and time.  Skin: Skin is warm and dry.  Psychiatric: She has a normal mood and affect. Her behavior is normal.  note- lungs CTAB, abdomen- nontender, nondistended, NABS  ED Course  Procedures (including critical care time)  DIAGNOSTIC STUDIES: Oxygen Saturation is 100% on RA, Normal by my interpretation.    COORDINATION OF CARE: 6:39 PM- Will order blood work. Will give pain medication. Will order EKG. Discussed treatment plan with pt at bedside and  pt agreed to plan.      Labs Review Labs Reviewed  CBC WITH DIFFERENTIAL - Abnormal; Notable for the following:    RBC 3.78 (*)    Hemoglobin 11.1 (*)    HCT 32.7 (*)    Neutrophils Relative % 30 (*)    Lymphocytes Relative 58 (*)    All other components within normal limits  BASIC METABOLIC PANEL  TROPONIN I   Imaging Review Dg Chest 2 View  07/06/2013   CLINICAL DATA:  Chest and left arm pain today, smoker  EXAM: CHEST  2 VIEW  COMPARISON:  12/13/2011  FINDINGS: Normal heart size, mediastinal contours, and pulmonary vascularity.  Lungs clear.  Bones unremarkable.  No pneumothorax.  IMPRESSION: Normal exam.   Electronically Signed   By: Ulyses Southward M.D.   On: 07/06/2013 18:55    EKG Interpretation     Ventricular Rate:  73 PR Interval:  124 QRS Duration: 78 QT Interval:  404 QTC Calculation: 445 R Axis:   2 Text Interpretation:  Normal sinus rhythm Minimal voltage criteria for LVH, may be normal variant poor r wave progression Abnormal ECG No significant change since last tracing  of 4/13            MDM   1. Chest wall pain    Pt presenting with c/o pain in her left chest- pain came on at rest and has been constant for several hours.  Pt has reassuring EKG, no risk factors, timi score of 0, Perc negative.  CXR reassuring and labs also reassuring.  Pain is reproducible in left anterior chest wall.  Advised ibuprofen.  Discharged with strict return precautions.  Pt agreeable with plan.  I personally performed the services described in this documentation, which was scribed in my presence. The recorded information has been reviewed and is accurate.   Ethelda Chick, MD 07/06/13 [phone]

## 2013-09-30 ENCOUNTER — Emergency Department (HOSPITAL_BASED_OUTPATIENT_CLINIC_OR_DEPARTMENT_OTHER): Payer: Medicaid Other

## 2013-09-30 ENCOUNTER — Encounter (HOSPITAL_BASED_OUTPATIENT_CLINIC_OR_DEPARTMENT_OTHER): Payer: Self-pay | Admitting: Emergency Medicine

## 2013-09-30 ENCOUNTER — Emergency Department (HOSPITAL_BASED_OUTPATIENT_CLINIC_OR_DEPARTMENT_OTHER)
Admission: EM | Admit: 2013-09-30 | Discharge: 2013-10-01 | Disposition: A | Discharge status: Home / Self Care | Payer: Medicaid Other | Attending: Emergency Medicine | Admitting: Emergency Medicine

## 2013-09-30 DIAGNOSIS — R0789 Other chest pain: Secondary | ICD-10-CM

## 2013-09-30 DIAGNOSIS — Z79899 Other long term (current) drug therapy: Secondary | ICD-10-CM | POA: Insufficient documentation

## 2013-09-30 DIAGNOSIS — R071 Chest pain on breathing: Secondary | ICD-10-CM | POA: Insufficient documentation

## 2013-09-30 DIAGNOSIS — R51 Headache: Secondary | ICD-10-CM | POA: Insufficient documentation

## 2013-09-30 DIAGNOSIS — F17200 Nicotine dependence, unspecified, uncomplicated: Secondary | ICD-10-CM | POA: Insufficient documentation

## 2013-09-30 LAB — BASIC METABOLIC PANEL
BUN: 8 mg/dL (ref 6–23)
CO2: 24 mEq/L (ref 19–32)
Calcium: 9.7 mg/dL (ref 8.4–10.5)
Chloride: 100 mEq/L (ref 96–112)
Creatinine, Ser: 0.9 mg/dL (ref 0.50–1.10)
GFR calc Af Amer: >90 mL/min (ref >90)
GFR, EST NON AFRICAN AMERICAN: 79 mL/min — AB (ref >90)
Glucose, Bld: 107 mg/dL — ABNORMAL HIGH (ref 70–99)
Potassium: 4.4 mEq/L (ref 3.7–5.3)
SODIUM: 138 meq/L (ref 137–147)

## 2013-09-30 LAB — CBC WITH DIFFERENTIAL/PLATELET
Basophils Absolute: 0 10*3/uL (ref 0.0–0.1)
Basophils Relative: 1 % (ref 0–1)
Eosinophils Absolute: 0.1 10*3/uL (ref 0.0–0.7)
Eosinophils Relative: 2 % (ref 0–5)
HCT: 38.7 % (ref 36.0–46.0)
Hemoglobin: 13 g/dL (ref 12.0–15.0)
LYMPHS ABS: 3.3 10*3/uL (ref 0.7–4.0)
Lymphocytes Relative: 57 % — ABNORMAL HIGH (ref 12–46)
MCH: 28.8 pg (ref 26.0–34.0)
MCHC: 33.6 g/dL (ref 30.0–36.0)
MCV: 85.6 fL (ref 78.0–100.0)
Monocytes Absolute: 0.6 10*3/uL (ref 0.1–1.0)
Monocytes Relative: 10 % (ref 3–12)
Neutro Abs: 1.8 10*3/uL (ref 1.7–7.7)
Neutrophils Relative %: 31 % — ABNORMAL LOW (ref 43–77)
PLATELETS: 365 10*3/uL (ref 150–400)
RBC: 4.52 MIL/uL (ref 3.87–5.11)
RDW: 12.9 % (ref 11.5–15.5)
WBC: 5.9 10*3/uL (ref 4.0–10.5)

## 2013-09-30 LAB — TROPONIN I: Troponin I: <0.3 ng/mL (ref <0.30)

## 2013-09-30 NOTE — ED Notes (Signed)
Pt c/o left sided chest pain and left sided headache starting tonight. Pt denies any shob, n/v

## 2013-09-30 NOTE — ED Notes (Signed)
Pt states chest pain worsens with movement. 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 Pt was at work when pain started.

## 2013-10-01 ENCOUNTER — Telehealth (HOSPITAL_BASED_OUTPATIENT_CLINIC_OR_DEPARTMENT_OTHER): Payer: Self-pay | Admitting: *Deleted

## 2013-10-01 LAB — TROPONIN I

## 2013-10-01 MED ORDER — IBUPROFEN 800 MG PO TABS: 800.0000 mg | ORAL_TABLET | Freq: Three times a day (TID) | ORAL | Status: DC

## 2013-10-01 MED ORDER — KETOROLAC TROMETHAMINE 60 MG/2ML IM SOLN
60.0000 mg | Freq: Once | INTRAMUSCULAR | Status: AC
Start: 2013-10-01 — End: 2013-10-01
  Administered 2013-10-01: 60 mg via INTRAMUSCULAR
  Filled 2013-10-01: qty 2

## 2013-10-01 NOTE — ED Provider Notes (Signed)
CSN: [id number]     Arrival date & time 09/30/13  2217 History   First MD Initiated Contact with Patient 09/30/13 2358     Chief Complaint  Patient presents with  . Chest Pain  . Headache   (Consider location/radiation/quality/duration/timing/severity/associated sxs/prior Treatment) Patient is a 43 y.o. female presenting with chest pain and headaches. The history is provided by the patient. No language interpreter was used.  Chest Pain Pain location:  L chest Pain quality: no pressure, not radiating and no tightness   Pain radiates to:  Does not radiate Pain radiates to the back: no   Pain severity:  Moderate Onset quality:  Gradual Timing:  Constant Progression:  Unchanged Chronicity:  Recurrent Context: lifting   Relieved by:  Nothing Worsened by:  Nothing tried Ineffective treatments:  None tried Associated symptoms: headache   Associated symptoms: no abdominal pain, no palpitations and no shortness of breath   Headaches:    Severity:  Mild   Onset quality:  Gradual   Timing:  Constant   Progression:  Unchanged Risk factors: no aortic disease   Headache Associated symptoms: no abdominal pain     History reviewed. No pertinent past medical history. Past Surgical History  Procedure Laterality Date  . Knee surgery      Left Knee   No family history on file. History  Substance Use Topics  . Smoking status: Current Every Day Smoker -- 0.50 packs/day for 10 years    Types: Cigarettes  . Smokeless tobacco: Not on file  . Alcohol Use: No   OB History   Grav Para Term Preterm Abortions TAB SAB Ect Mult Living                 Review of Systems  Respiratory: Negative for chest tightness and shortness of breath.   Cardiovascular: Positive for chest pain. Negative for palpitations and leg swelling.  Gastrointestinal: Negative for abdominal pain.  Neurological: Positive for headaches.  All other systems reviewed and are negative.    Allergies  Review of patient's  allergies indicates no known allergies.  Home Medications   Current Outpatient Rx  Name  Route  Sig  Dispense  Refill  . Chlorphen-Phenyleph-ASA (ALKA-SELTZER PLUS COLD PO)   Oral   Take 2 tablets by mouth daily as needed. Patient used this medication for cold symptoms.         Marland Kitchen ibuprofen (ADVIL,MOTRIN) 200 MG tablet   Oral   Take 400 mg by mouth every 6 (six) hours as needed. Patient used this medication for pain.         . ranitidine (ZANTAC) 150 MG tablet   Oral   Take 1 tablet (150 mg total) by mouth 2 (two) times daily.   60 tablet   0    BP 142/86  Pulse 78  Temp(Src) 97.6 F (36.4 C) (Oral)  Resp 18  Ht 5\' 3"  (1.6 m)  Wt 163 lb (73.936 kg)  BMI 28.88 kg/m2  SpO2 100% Physical Exam  Constitutional: She is oriented to person, place, and time. She appears well-developed and well-nourished. No distress.  HENT:  Head: Normocephalic and atraumatic.  Mouth/Throat: Oropharynx is clear and moist.  Eyes: Conjunctivae are normal. Pupils are equal, round, and reactive to light.  Neck: Normal range of motion. Neck supple.  Cardiovascular: Normal rate, regular rhythm and intact distal pulses.   Pulmonary/Chest: No respiratory distress. She has no wheezes. She has no rales. She exhibits tenderness.  Abdominal: Soft. Bowel sounds  are normal. There is no tenderness. There is no rebound and no guarding.  Musculoskeletal: Normal range of motion.  Neurological: She is alert and oriented to person, place, and time. She has normal reflexes. She displays normal reflexes. Coordination normal.  Skin: Skin is warm and dry.  Psychiatric: Thought content normal.    ED Course  Procedures (including critical care time) Labs Review Labs Reviewed  CBC WITH DIFFERENTIAL - Abnormal; Notable for the following:    Neutrophils Relative % 31 (*)    Lymphocytes Relative 57 (*)    All other components within normal limits  BASIC METABOLIC PANEL - Abnormal; Notable for the following:     Glucose, Bld 107 (*)    GFR calc non Af Amer 79 (*)    All other components within normal limits  TROPONIN I  TROPONIN I   Imaging Review Dg Chest 2 View  10/01/2013   CLINICAL DATA:  Chest pain and headache.  History of smoking.  EXAM: CHEST  2 VIEW  COMPARISON:  Chest radiograph performed 07/06/2013  FINDINGS: The lungs are well-aerated and clear. There is no evidence of focal opacification, pleural effusion or pneumothorax.  The heart is normal in size; the mediastinal contour is within normal limits. No acute osseous abnormalities are seen.  IMPRESSION: No acute cardiopulmonary process seen.   Electronically Signed   By: Roanna RaiderJeffery  Chang M.D.   On: 10/01/2013 00:04    EKG Interpretation    Date/Time:  Wednesday September 30 2013 22:26:40 EST Ventricular Rate:  76 PR Interval:  138 QRS Duration: 78 QT Interval:  372 QTC Calculation: 418 R Axis:   47 Text Interpretation:  Normal sinus rhythm Confirmed by [facility]  MD, Dajai Wahlert ([id number]) on 10/01/2013 12:03:15 AM           PERC negative wells 0 MDM  No diagnosis found. Symptoms consistent with MSK pain.  Doubt cardiac or pulmonary etiology.  Low risk for PE perc and wells both negative.  ACS excluded  Symptoms consistent with MSK pain from repeative lifting  Hoyle Barkdull K Chanc Kervin-Rasch, MD 10/01/13 [phone]

## 2013-10-01 NOTE — ED Notes (Signed)
MD at bedside discussing test results and plan of care for dispo.  

## 2013-10-01 NOTE — ED Notes (Signed)
Pt requesting work note stating she needs to be out of work until Monday 10/05/2013 due to no available light duty at her job. Per Dr Nicanor AlconPalumbo, pt may be given note taking her out of work until 10/05/13 due to injury. Pt called and made aware she would need to come pick up work note at front desk. Pt agrees with plan of care.

## 2013-10-01 NOTE — Discharge Instructions (Signed)

## 2013-12-21 ENCOUNTER — Emergency Department (HOSPITAL_BASED_OUTPATIENT_CLINIC_OR_DEPARTMENT_OTHER)
Admission: EM | Admit: 2013-12-21 | Discharge: 2013-12-21 | Disposition: A | Discharge status: Home / Self Care | Payer: Medicaid Other | Attending: Emergency Medicine | Admitting: Emergency Medicine

## 2013-12-21 ENCOUNTER — Encounter (HOSPITAL_BASED_OUTPATIENT_CLINIC_OR_DEPARTMENT_OTHER): Payer: Self-pay | Admitting: Emergency Medicine

## 2013-12-21 DIAGNOSIS — IMO0002 Reserved for concepts with insufficient information to code with codable children: Secondary | ICD-10-CM | POA: Insufficient documentation

## 2013-12-21 DIAGNOSIS — F17200 Nicotine dependence, unspecified, uncomplicated: Secondary | ICD-10-CM | POA: Insufficient documentation

## 2013-12-21 DIAGNOSIS — J309 Allergic rhinitis, unspecified: Secondary | ICD-10-CM | POA: Insufficient documentation

## 2013-12-21 DIAGNOSIS — R51 Headache: Secondary | ICD-10-CM | POA: Insufficient documentation

## 2013-12-21 MED ORDER — FLUTICASONE PROPIONATE 50 MCG/ACT NA SUSP: 2.0000 | Freq: Every day | NASAL | Status: DC

## 2013-12-21 NOTE — ED Notes (Signed)
Stuffy nose, facial pressure and headache since yesterday.

## 2013-12-21 NOTE — ED Provider Notes (Signed)
CSN: [id number]     Arrival date & time 12/21/13  1050 History   First MD Initiated Contact with Patient 12/21/13 1104     Chief Complaint  Patient presents with  . URI     (Consider location/radiation/quality/duration/timing/severity/associated sxs/prior Treatment) Patient is a 43 y.o. female presenting with URI. The history is provided by the patient. No language interpreter was used.  URI Presenting symptoms: congestion and facial pain   Presenting symptoms: no fever   Severity:  Moderate Onset quality:  Sudden Duration:  1 day Timing:  Constant Progression:  Unchanged Chronicity:  New Ineffective treatments:  OTC medications Associated symptoms: headaches     History reviewed. No pertinent past medical history. Past Surgical History  Procedure Laterality Date  . Knee surgery      Left Knee   No family history on file. History  Substance Use Topics  . Smoking status: Current Every Day Smoker -- 0.50 packs/day for 10 years    Types: Cigarettes  . Smokeless tobacco: Not on file  . Alcohol Use: No   OB History   Grav Para Term Preterm Abortions TAB SAB Ect Mult Living                 Review of Systems  Constitutional: Negative for fever.  HENT: Positive for congestion.   Respiratory: Negative.   Neurological: Positive for headaches.      Allergies  Review of patient's allergies indicates no known allergies.  Home Medications   Current Outpatient Rx  Name  Route  Sig  Dispense  Refill  . Chlorphen-Phenyleph-ASA (ALKA-SELTZER PLUS COLD PO)   Oral   Take 2 tablets by mouth daily as needed. Patient used this medication for cold symptoms.         . fluticasone (FLONASE) 50 MCG/ACT nasal spray   Each Nare   Place 2 sprays into both nostrils daily.   16 g   2   . ibuprofen (ADVIL,MOTRIN) 200 MG tablet   Oral   Take 400 mg by mouth every 6 (six) hours as needed. Patient used this medication for pain.         Marland Kitchen. ibuprofen (ADVIL,MOTRIN) 800 MG  tablet   Oral   Take 1 tablet (800 mg total) by mouth 3 (three) times daily.   21 tablet   0   . ranitidine (ZANTAC) 150 MG tablet   Oral   Take 1 tablet (150 mg total) by mouth 2 (two) times daily.   60 tablet   0    BP 145/95  Pulse 80  Temp(Src) 98.1 F (36.7 C) (Oral)  Resp 20  Ht 5\' 5"  (1.651 m)  Wt 155 lb (70.308 kg)  BMI 25.79 kg/m2  SpO2 100% Physical Exam  Nursing note and vitals reviewed. Constitutional: She appears well-developed and well-nourished.  HENT:  Head: Normocephalic and atraumatic.  Right Ear: External ear normal.  Left Ear: External ear normal.  Nose: Mucosal edema and rhinorrhea present.  Cardiovascular: Normal rate and regular rhythm.   Pulmonary/Chest: Effort normal and breath sounds normal.    ED Course  Procedures (including critical care time) Labs Review Labs Reviewed - No data to display Imaging Review No results found.   EKG Interpretation None      MDM   Final diagnoses:  Allergic rhinitis    Will have pt treat symptomatically;doubt sinusitis    Teressa LowerVrinda Mckinzey Entwistle, NP 12/21/13 1218

## 2013-12-21 NOTE — ED Provider Notes (Signed)
History/physical exam/procedure(s) were performed by non-physician practitioner and as supervising physician I was immediately available for consultation/collaboration. I have reviewed all notes and am in agreement with care and plan.   Findley Blankenbaker S Janith Nielson, MD 12/21/13 1614 

## 2013-12-21 NOTE — Discharge Instructions (Signed)
Allergic Rhinitis Allergic rhinitis is when the mucous membranes in the nose respond to allergens. Allergens are particles in the air that cause your body to have an allergic reaction. This causes you to release allergic antibodies. Through a chain of events, these eventually cause you to release histamine into the blood stream. Although meant to protect the body, it is this release of histamine that causes your discomfort, such as frequent sneezing, congestion, and an itchy, runny nose.  CAUSES  Seasonal allergic rhinitis (hay fever) is caused by pollen allergens that may come from grasses, trees, and weeds. Year-round allergic rhinitis (perennial allergic rhinitis) is caused by allergens such as house dust mites, pet dander, and mold spores.  SYMPTOMS   Nasal stuffiness (congestion).  Itchy, runny nose with sneezing and tearing of the eyes. DIAGNOSIS  Your health care provider can help you determine the allergen or allergens that trigger your symptoms. If you and your health care provider are unable to determine the allergen, skin or blood testing may be used. TREATMENT  Allergic Rhinitis does not have a cure, but it can be controlled by:  Medicines and allergy shots (immunotherapy).  Avoiding the allergen. Hay fever may often be treated with antihistamines in pill or nasal spray forms. Antihistamines block the effects of histamine. There are over-the-counter medicines that may help with nasal congestion and swelling around the eyes. Check with your health care provider before taking or giving this medicine.  If avoiding the allergen or the medicine prescribed do not work, there are many new medicines your health care provider can prescribe. Stronger medicine may be used if initial measures are ineffective. Desensitizing injections can be used if medicine and avoidance does not work. Desensitization is when a patient is given ongoing shots until the body becomes less sensitive to the allergen.  Make sure you follow up with your health care provider if problems continue. HOME CARE INSTRUCTIONS It is not possible to completely avoid allergens, but you can reduce your symptoms by taking steps to limit your exposure to them. It helps to know exactly what you are allergic to so that you can avoid your specific triggers. SEEK MEDICAL CARE IF:   You have a fever.  You develop a cough that does not stop easily (persistent).  You have shortness of breath.  You start wheezing.  Symptoms interfere with normal daily activities. Document Released: 05/22/2001 Document Revised: 06/17/2013 Document Reviewed: 05/04/2013 [employer] Patient Information 2014 [employer].  

## 2014-02-12 ENCOUNTER — Emergency Department (HOSPITAL_BASED_OUTPATIENT_CLINIC_OR_DEPARTMENT_OTHER)
Admission: EM | Admit: 2014-02-12 | Discharge: 2014-02-13 | Disposition: A | Discharge status: Home / Self Care | Payer: Medicaid Other | Attending: Emergency Medicine | Admitting: Emergency Medicine

## 2014-02-12 ENCOUNTER — Encounter (HOSPITAL_BASED_OUTPATIENT_CLINIC_OR_DEPARTMENT_OTHER): Payer: Self-pay | Admitting: Emergency Medicine

## 2014-02-12 DIAGNOSIS — IMO0002 Reserved for concepts with insufficient information to code with codable children: Secondary | ICD-10-CM | POA: Insufficient documentation

## 2014-02-12 DIAGNOSIS — Z87891 Personal history of nicotine dependence: Secondary | ICD-10-CM | POA: Insufficient documentation

## 2014-02-12 DIAGNOSIS — F17200 Nicotine dependence, unspecified, uncomplicated: Secondary | ICD-10-CM | POA: Insufficient documentation

## 2014-02-12 DIAGNOSIS — R109 Unspecified abdominal pain: Secondary | ICD-10-CM

## 2014-02-12 DIAGNOSIS — R1011 Right upper quadrant pain: Secondary | ICD-10-CM | POA: Insufficient documentation

## 2014-02-12 DIAGNOSIS — Z3202 Encounter for pregnancy test, result negative: Secondary | ICD-10-CM | POA: Insufficient documentation

## 2014-02-12 DIAGNOSIS — Z79899 Other long term (current) drug therapy: Secondary | ICD-10-CM | POA: Insufficient documentation

## 2014-02-12 DIAGNOSIS — Z791 Long term (current) use of non-steroidal anti-inflammatories (NSAID): Secondary | ICD-10-CM | POA: Insufficient documentation

## 2014-02-12 LAB — CBC WITH DIFFERENTIAL/PLATELET
BASOS ABS: 0 10*3/uL (ref 0.0–0.1)
Basophils Relative: 0 % (ref 0–1)
Eosinophils Absolute: 0.2 10*3/uL (ref 0.0–0.7)
Eosinophils Relative: 3 % (ref 0–5)
HCT: 37.5 % (ref 36.0–46.0)
Hemoglobin: 13.1 g/dL (ref 12.0–15.0)
LYMPHS PCT: 47 % — AB (ref 12–46)
Lymphs Abs: 3.3 10*3/uL (ref 0.7–4.0)
MCH: 30.5 pg (ref 26.0–34.0)
MCHC: 34.9 g/dL (ref 30.0–36.0)
MCV: 87.2 fL (ref 78.0–100.0)
Monocytes Absolute: 0.7 10*3/uL (ref 0.1–1.0)
Monocytes Relative: 10 % (ref 3–12)
NEUTROS PCT: 40 % — AB (ref 43–77)
Neutro Abs: 2.8 10*3/uL (ref 1.7–7.7)
PLATELETS: 324 10*3/uL (ref 150–400)
RBC: 4.3 MIL/uL (ref 3.87–5.11)
RDW: 12 % (ref 11.5–15.5)
WBC: 7 10*3/uL (ref 4.0–10.5)

## 2014-02-12 LAB — URINALYSIS, ROUTINE W REFLEX MICROSCOPIC
Bilirubin Urine: NEGATIVE
GLUCOSE, UA: NEGATIVE mg/dL
KETONES UR: NEGATIVE mg/dL
Nitrite: NEGATIVE
Protein, ur: NEGATIVE mg/dL
Specific Gravity, Urine: 1.024 (ref 1.005–1.030)
Urobilinogen, UA: 1 mg/dL (ref 0.0–1.0)
pH: 6 (ref 5.0–8.0)

## 2014-02-12 LAB — BASIC METABOLIC PANEL
BUN: 10 mg/dL (ref 6–23)
CO2: 26 mEq/L (ref 19–32)
Calcium: 9.6 mg/dL (ref 8.4–10.5)
Chloride: 104 mEq/L (ref 96–112)
Creatinine, Ser: 1 mg/dL (ref 0.50–1.10)
GFR calc Af Amer: 81 mL/min — ABNORMAL LOW (ref >90)
GFR, EST NON AFRICAN AMERICAN: 69 mL/min — AB (ref >90)
GLUCOSE: 92 mg/dL (ref 70–99)
POTASSIUM: 4 meq/L (ref 3.7–5.3)
SODIUM: 142 meq/L (ref 137–147)

## 2014-02-12 LAB — URINE MICROSCOPIC-ADD ON

## 2014-02-12 LAB — PREGNANCY, URINE: Preg Test, Ur: NEGATIVE

## 2014-02-12 LAB — LIPASE, BLOOD: LIPASE: 25 U/L (ref 11–59)

## 2014-02-12 MED ORDER — FAMOTIDINE 20 MG PO TABS
40.0000 mg | ORAL_TABLET | Freq: Once | ORAL | Status: AC
Start: 2014-02-12 — End: 2014-02-12
  Administered 2014-02-12: 40 mg via ORAL
  Filled 2014-02-12: qty 2

## 2014-02-12 MED ORDER — OXYCODONE-ACETAMINOPHEN 5-325 MG PO TABS
1.0000 | ORAL_TABLET | ORAL | Status: DC | PRN
Start: 2014-02-12 — End: 2014-06-09

## 2014-02-12 MED ORDER — HYDROMORPHONE HCL PF 1 MG/ML IJ SOLN
1.0000 mg | Freq: Once | INTRAMUSCULAR | Status: AC
Start: 2014-02-12 — End: 2014-02-12
  Administered 2014-02-12: 1 mg via INTRAVENOUS
  Filled 2014-02-12: qty 1

## 2014-02-12 MED ORDER — GI COCKTAIL ~~LOC~~
30.0000 mL | Freq: Once | ORAL | Status: AC
  Administered 2014-02-12: 30 mL via ORAL
  Filled 2014-02-12: qty 30

## 2014-02-12 MED ORDER — SUCRALFATE 1 G PO TABS
1.0000 g | ORAL_TABLET | Freq: Four times a day (QID) | ORAL | Status: DC
Start: 2014-02-12 — End: 2014-06-09

## 2014-02-12 MED ORDER — FAMOTIDINE 20 MG PO TABS: 20.0000 mg | ORAL_TABLET | Freq: Two times a day (BID) | ORAL | Status: DC

## 2014-02-12 MED ORDER — ONDANSETRON HCL 4 MG/2ML IJ SOLN
4.0000 mg | Freq: Once | INTRAMUSCULAR | Status: AC
  Administered 2014-02-12: 4 mg via INTRAVENOUS
  Filled 2014-02-12: qty 2

## 2014-02-12 NOTE — Discharge Instructions (Signed)
Abdominal Pain, Adult °Many things can cause abdominal pain. Usually, abdominal pain is not caused by a disease and will improve without treatment. It can often be observed and treated at home. Your health care provider will do a physical exam and possibly order blood tests and X-rays to help determine the seriousness of your pain. However, in many cases, more time must pass before a clear cause of the pain can be found. Before that point, your health care provider may not know if you need more testing or further treatment. °HOME CARE INSTRUCTIONS  °Monitor your abdominal pain for any changes. The following actions may help to alleviate any discomfort you are experiencing: °· Only take over-the-counter or prescription medicines as directed by your health care provider. °· Do not take laxatives unless directed to do so by your health care provider. °· Try a clear liquid diet (broth, tea, or water) as directed by your health care provider. Slowly move to a bland diet as tolerated. °SEEK MEDICAL CARE IF: °· You have unexplained abdominal pain. °· You have abdominal pain associated with nausea or diarrhea. °· You have pain when you urinate or have a bowel movement. °· You experience abdominal pain that wakes you in the night. °· You have abdominal pain that is worsened or improved by eating food. °· You have abdominal pain that is worsened with eating fatty foods. °SEEK IMMEDIATE MEDICAL CARE IF:  °· Your pain does not go away within 2 hours. °· You have a fever. °· You keep throwing up (vomiting). °· Your pain is felt only in portions of the abdomen, such as the right side or the left lower portion of the abdomen. °· You pass bloody or black tarry stools. °MAKE SURE YOU: °· Understand these instructions.   °· Will watch your condition.   °· Will get help right away if you are not doing well or get worse.   °Document Released: 06/06/2005 Document Revised: 06/17/2013 Document Reviewed: 05/06/2013 °[employer]® Patient  Information ©2014 [employer]. ° °

## 2014-02-12 NOTE — ED Provider Notes (Signed)
CSN: [id number]     Arrival date & time 02/12/14  2104 History   This chart was scribed for Toy Baker, MD by Blanchard Kelch, ED Scribe. The patient was seen in room MH01/MH01. Patient's care was started at 10:12 PM.    Chief Complaint  Patient presents with  . Abdominal Pain     Patient is a 43 y.o. female presenting with abdominal pain. The history is provided by the patient. No language interpreter was used.  Abdominal Pain Associated symptoms: no dysuria, no fever and no vomiting     HPI Comments: Sherry Clark is a 43 y.o. female who presents to the Emergency Department complaining of recurrent, constant right upper abdominal pain that began today. She denies aggravating factors. She took a Percocet at home without relief. She also reports frequency. She denies fever, vomiting or dysuria. She reports similar pain in the past that was diagnosed as gastritis. She has an appointment with her doctor in three days but couldn't wait due to the pain. She denies a surgical history of cholecystectomy. She denies alcohol use. She stopped smoking three weeks ago.    History reviewed. No pertinent past medical history. Past Surgical History  Procedure Laterality Date  . Knee surgery      Left Knee   No family history on file. History  Substance Use Topics  . Smoking status: Current Every Day Smoker -- 0.50 packs/day for 10 years    Types: Cigarettes  . Smokeless tobacco: Not on file  . Alcohol Use: No   OB History   Grav Para Term Preterm Abortions TAB SAB Ect Mult Living                 Review of Systems  Constitutional: Negative for fever.  Gastrointestinal: Positive for abdominal pain. Negative for vomiting.  Genitourinary: Positive for frequency. Negative for dysuria.  All other systems reviewed and are negative.     Allergies  Review of patient's allergies indicates no known allergies.  Home Medications   Prior to Admission medications   Medication Sig Start  Date End Date Taking? Authorizing Provider  Chlorphen-Phenyleph-ASA (ALKA-SELTZER PLUS COLD PO) Take 2 tablets by mouth daily as needed. Patient used this medication for cold symptoms.    Historical Provider, MD  fluticasone (FLONASE) 50 MCG/ACT nasal spray Place 2 sprays into both nostrils daily. 12/21/13   Teressa Lower, NP  ibuprofen (ADVIL,MOTRIN) 200 MG tablet Take 400 mg by mouth every 6 (six) hours as needed. Patient used this medication for pain.    Historical Provider, MD  ibuprofen (ADVIL,MOTRIN) 800 MG tablet Take 1 tablet (800 mg total) by mouth 3 (three) times daily. 10/01/13   April K Palumbo-Rasch, MD  ranitidine (ZANTAC) 150 MG tablet Take 1 tablet (150 mg total) by mouth 2 (two) times daily. 06/14/13   Ethelda Chick, MD   Triage Vitals: BP 128/85  Pulse 65  Temp(Src) 97.7 F (36.5 C) (Oral)  Resp 20  Ht 5\' 3"  (1.6 m)  Wt 155 lb (70.308 kg)  BMI 27.46 kg/m2  SpO2 100%  LMP 02/11/2014  Physical Exam  Nursing note and vitals reviewed. Constitutional: She is oriented to person, place, and time. She appears well-developed and well-nourished.  Non-toxic appearance. No distress.  HENT:  Head: Normocephalic and atraumatic.  Eyes: Conjunctivae, EOM and lids are normal. Pupils are equal, round, and reactive to light.  Neck: Normal range of motion. Neck supple. No tracheal deviation present. No mass present.  Cardiovascular:  Normal rate, regular rhythm and normal heart sounds.  Exam reveals no gallop.   No murmur heard. Pulmonary/Chest: Effort normal and breath sounds normal. No stridor. No respiratory distress. She has no decreased breath sounds. She has no wheezes. She has no rhonchi. She has no rales.  Abdominal: Soft. Normal appearance and bowel sounds are normal. She exhibits no distension. There is tenderness. There is no rebound and no CVA tenderness.  Mild tenderness to palpation right upper quadrant without guarding or rebound.  Musculoskeletal: Normal range of motion.  She exhibits no edema and no tenderness.  Neurological: She is alert and oriented to person, place, and time. She has normal strength. No cranial nerve deficit or sensory deficit. GCS eye subscore is 4. GCS verbal subscore is 5. GCS motor subscore is 6.  Skin: Skin is warm and dry. No abrasion and no rash noted.  Psychiatric: She has a normal mood and affect. Her speech is normal and behavior is normal.    ED Course  Procedures (including critical care time)  DIAGNOSTIC STUDIES: Oxygen Saturation is 100% on room air, normal by my interpretation.    COORDINATION OF CARE: 10:15 PM - Patient verbalizes understanding and agrees with treatment plan.    Labs Review Labs Reviewed  URINALYSIS, ROUTINE W REFLEX MICROSCOPIC - Abnormal; Notable for the following:    Hgb urine dipstick LARGE (*)    Leukocytes, UA SMALL (*)    All other components within normal limits  URINE MICROSCOPIC-ADD ON - Abnormal; Notable for the following:    Squamous Epithelial / LPF FEW (*)    Bacteria, UA FEW (*)    All other components within normal limits  CBC WITH DIFFERENTIAL - Abnormal; Notable for the following:    Neutrophils Relative % 40 (*)    Lymphocytes Relative 47 (*)    All other components within normal limits  BASIC METABOLIC PANEL - Abnormal; Notable for the following:    GFR calc non Af Amer 69 (*)    GFR calc Af Amer 81 (*)    All other components within normal limits  PREGNANCY, URINE  LIPASE, BLOOD    Imaging Review No results found.   EKG Interpretation None      MDM   Final diagnoses:  None   Patient given meds here and feels better. Suspect that she has a recurrence of her gastritis. No concern for ACS for pulmonary embolism. Stable for discharge    Toy BakerAnthony T Deontra Pereyra, MD 02/12/14 2324

## 2014-02-12 NOTE — ED Notes (Signed)
Abdominal pain unrelieved with Percocet at 1pm.

## 2014-02-12 NOTE — ED Notes (Signed)
Pt ambulated to restroom with nausea, pt reports she was unable to vomit but nausea still is there.

## 2014-02-13 ENCOUNTER — Emergency Department (HOSPITAL_BASED_OUTPATIENT_CLINIC_OR_DEPARTMENT_OTHER): Payer: Medicaid Other

## 2014-02-13 ENCOUNTER — Emergency Department (HOSPITAL_BASED_OUTPATIENT_CLINIC_OR_DEPARTMENT_OTHER)
Admission: EM | Admit: 2014-02-13 | Discharge: 2014-02-13 | Disposition: A | Discharge status: Home / Self Care | Payer: Medicaid Other | Attending: Emergency Medicine | Admitting: Emergency Medicine

## 2014-02-13 ENCOUNTER — Encounter (HOSPITAL_BASED_OUTPATIENT_CLINIC_OR_DEPARTMENT_OTHER): Payer: Self-pay | Admitting: Emergency Medicine

## 2014-02-13 DIAGNOSIS — R1013 Epigastric pain: Secondary | ICD-10-CM | POA: Insufficient documentation

## 2014-02-13 DIAGNOSIS — Z79899 Other long term (current) drug therapy: Secondary | ICD-10-CM | POA: Insufficient documentation

## 2014-02-13 DIAGNOSIS — IMO0002 Reserved for concepts with insufficient information to code with codable children: Secondary | ICD-10-CM | POA: Insufficient documentation

## 2014-02-13 DIAGNOSIS — Z791 Long term (current) use of non-steroidal anti-inflammatories (NSAID): Secondary | ICD-10-CM | POA: Insufficient documentation

## 2014-02-13 DIAGNOSIS — R072 Precordial pain: Secondary | ICD-10-CM | POA: Insufficient documentation

## 2014-02-13 DIAGNOSIS — R079 Chest pain, unspecified: Secondary | ICD-10-CM

## 2014-02-13 DIAGNOSIS — Z87891 Personal history of nicotine dependence: Secondary | ICD-10-CM | POA: Insufficient documentation

## 2014-02-13 DIAGNOSIS — R109 Unspecified abdominal pain: Secondary | ICD-10-CM

## 2014-02-13 LAB — COMPREHENSIVE METABOLIC PANEL
ALK PHOS: 49 U/L (ref 39–117)
ALT: 14 U/L (ref 0–35)
AST: 13 U/L (ref 0–37)
Albumin: 3.6 g/dL (ref 3.5–5.2)
BUN: 9 mg/dL (ref 6–23)
CHLORIDE: 101 meq/L (ref 96–112)
CO2: 27 meq/L (ref 19–32)
Calcium: 9.4 mg/dL (ref 8.4–10.5)
Creatinine, Ser: 0.8 mg/dL (ref 0.50–1.10)
GFR calc non Af Amer: >90 mL/min (ref >90)
GLUCOSE: 103 mg/dL — AB (ref 70–99)
POTASSIUM: 4.2 meq/L (ref 3.7–5.3)
Sodium: 139 mEq/L (ref 137–147)
Total Bilirubin: <0.2 mg/dL — ABNORMAL LOW (ref 0.3–1.2)
Total Protein: 7.4 g/dL (ref 6.0–8.3)

## 2014-02-13 LAB — CBC WITH DIFFERENTIAL/PLATELET
Basophils Absolute: 0 10*3/uL (ref 0.0–0.1)
Basophils Relative: 1 % (ref 0–1)
Eosinophils Absolute: 0.1 10*3/uL (ref 0.0–0.7)
Eosinophils Relative: 1 % (ref 0–5)
HCT: 36.7 % (ref 36.0–46.0)
Hemoglobin: 12.9 g/dL (ref 12.0–15.0)
LYMPHS ABS: 2.9 10*3/uL (ref 0.7–4.0)
Lymphocytes Relative: 47 % — ABNORMAL HIGH (ref 12–46)
MCH: 30.8 pg (ref 26.0–34.0)
MCHC: 35.1 g/dL (ref 30.0–36.0)
MCV: 87.6 fL (ref 78.0–100.0)
Monocytes Absolute: 0.7 10*3/uL (ref 0.1–1.0)
Monocytes Relative: 11 % (ref 3–12)
NEUTROS PCT: 40 % — AB (ref 43–77)
Neutro Abs: 2.5 10*3/uL (ref 1.7–7.7)
PLATELETS: 333 10*3/uL (ref 150–400)
RBC: 4.19 MIL/uL (ref 3.87–5.11)
RDW: 12 % (ref 11.5–15.5)
WBC: 6.1 10*3/uL (ref 4.0–10.5)

## 2014-02-13 LAB — TROPONIN I: Troponin I: <0.3 ng/mL (ref <0.30)

## 2014-02-13 MED ORDER — HYDROMORPHONE HCL PF 1 MG/ML IJ SOLN
1.0000 mg | Freq: Once | INTRAMUSCULAR | Status: AC
  Administered 2014-02-13: 1 mg via INTRAVENOUS
  Filled 2014-02-13: qty 1

## 2014-02-13 MED ORDER — ONDANSETRON HCL 4 MG/2ML IJ SOLN
4.0000 mg | Freq: Once | INTRAMUSCULAR | Status: AC
  Administered 2014-02-13: 4 mg via INTRAVENOUS

## 2014-02-13 MED ORDER — TRAMADOL HCL 50 MG PO TABS: 50.0000 mg | ORAL_TABLET | Freq: Four times a day (QID) | ORAL | Status: DC | PRN

## 2014-02-13 MED ORDER — OXYCODONE-ACETAMINOPHEN 5-325 MG PO TABS
2.0000 | ORAL_TABLET | Freq: Once | ORAL | Status: DC
  Filled 2014-02-13: qty 2

## 2014-02-13 MED ORDER — ONDANSETRON HCL 4 MG/2ML IJ SOLN
4.0000 mg | Freq: Once | INTRAMUSCULAR | Status: DC
  Filled 2014-02-13: qty 2

## 2014-02-13 MED ORDER — LANSOPRAZOLE 30 MG PO TBDP: 30.0000 mg | ORAL_TABLET | Freq: Every day | ORAL | Status: DC

## 2014-02-13 MED ORDER — GI COCKTAIL ~~LOC~~
30.0000 mL | Freq: Once | ORAL | Status: AC
  Administered 2014-02-13: 30 mL via ORAL
  Filled 2014-02-13: qty 30

## 2014-02-13 NOTE — ED Provider Notes (Signed)
CSN: [id number]     Arrival date & time 02/13/14  1857 History   First MD Initiated Contact with Patient 02/13/14 1927     Chief Complaint  Patient presents with  . Chest Pain     (Consider location/radiation/quality/duration/timing/severity/associated sxs/prior Treatment) Patient is a 43 y.o. female presenting with chest pain. The history is provided by the patient. No language interpreter was used.  Chest Pain Pain location:  Substernal area Pain quality: aching   Pain radiates to:  Does not radiate Pain radiates to the back: no   Pain severity:  Moderate Timing:  Constant Progression:  Worsening Chronicity:  New Context: movement   Context: not breathing   Relieved by:  Nothing Worsened by:  Nothing tried Ineffective treatments:  None tried Associated symptoms: abdominal pain   Risk factors: no aortic disease     History reviewed. No pertinent past medical history. Past Surgical History  Procedure Laterality Date  . Knee surgery      Left Knee  . Tubal ligation     No family history on file. History  Substance Use Topics  . Smoking status: Former Smoker -- 0.50 packs/day for 10 years    Types: Cigarettes  . Smokeless tobacco: Not on file  . Alcohol Use: No   OB History   Grav Para Term Preterm Abortions TAB SAB Ect Mult Living                 Review of Systems  Cardiovascular: Positive for chest pain.  Gastrointestinal: Positive for abdominal pain.  All other systems reviewed and are negative.     Allergies  Review of patient's allergies indicates no known allergies.  Home Medications   Prior to Admission medications   Medication Sig Start Date End Date Taking? Authorizing Provider  Chlorphen-Phenyleph-ASA (ALKA-SELTZER PLUS COLD PO) Take 2 tablets by mouth daily as needed. Patient used this medication for cold symptoms.    Historical Provider, MD  famotidine (PEPCID) 20 MG tablet Take 1 tablet (20 mg total) by mouth 2 (two) times daily. 02/12/14    Toy Baker, MD  fluticasone (FLONASE) 50 MCG/ACT nasal spray Place 2 sprays into both nostrils daily. 12/21/13   Teressa Lower, NP  ibuprofen (ADVIL,MOTRIN) 200 MG tablet Take 400 mg by mouth every 6 (six) hours as needed. Patient used this medication for pain.    Historical Provider, MD  ibuprofen (ADVIL,MOTRIN) 800 MG tablet Take 1 tablet (800 mg total) by mouth 3 (three) times daily. 10/01/13   April Smitty Cords, MD  oxyCODONE-acetaminophen (PERCOCET/ROXICET) 5-325 MG per tablet Take 1-2 tablets by mouth every 4 (four) hours as needed for moderate pain or severe pain. 02/12/14   Toy Baker, MD  ranitidine (ZANTAC) 150 MG tablet Take 1 tablet (150 mg total) by mouth 2 (two) times daily. 06/14/13   Ethelda Chick, MD  sucralfate (CARAFATE) 1 G tablet Take 1 tablet (1 g total) by mouth 4 (four) times daily. 02/12/14   Toy Baker, MD   BP 148/87  Pulse 60  Temp(Src) 98.2 F (36.8 C) (Oral)  Resp 17  Ht 5\' 3"  (1.6 m)  Wt 155 lb (70.308 kg)  BMI 27.46 kg/m2  SpO2 100%  LMP 02/11/2014 Physical Exam  Nursing note and vitals reviewed. Constitutional: She is oriented to person, place, and time. She appears well-developed and well-nourished.  HENT:  Head: Normocephalic and atraumatic.  Eyes: EOM are normal. Pupils are equal, round, and reactive to light.  Neck: Normal  range of motion.  Cardiovascular: Normal rate and normal heart sounds.   Pulmonary/Chest: Effort normal and breath sounds normal.  Abdominal: Soft. She exhibits no distension. There is tenderness.  Epigastric tenderness  Musculoskeletal: Normal range of motion.  Neurological: She is alert and oriented to person, place, and time.  Skin: Skin is warm.  Psychiatric: She has a normal mood and affect.    ED Course  Procedures (including critical care time) Labs Review Labs Reviewed  CBC WITH DIFFERENTIAL - Abnormal; Notable for the following:    Neutrophils Relative % 40 (*)    Lymphocytes Relative 47 (*)     All other components within normal limits  COMPREHENSIVE METABOLIC PANEL - Abnormal; Notable for the following:    Glucose, Bld 103 (*)    Total Bilirubin <0.2 (*)    All other components within normal limits  TROPONIN I    Imaging Review Dg Chest 2 View  02/13/2014   CLINICAL DATA:  Chronic right flank pain worse yesterday. And today.  EXAM: CHEST  2 VIEW  COMPARISON:  PA and lateral chest x-ray of September 30, 2013  FINDINGS: The lungs are well-expanded and clear. The heart and mediastinal structures are normal. There is no pleural effusion. The bony thorax is unremarkable.  IMPRESSION: There is no acute cardiopulmonary disease.   Electronically Signed   By: David  [place]   On: 02/13/2014 20:49     EKG Interpretation None      MDM ultrasound no acute,   Labs reviewed,   Troponin negative.   Percocet may have caused increased epigastric discomfort.   I will try tramadol and prevacid  Pt advised to see her Md for recheck on Monday   Final diagnoses:  Abdominal pain of unknown etiology  Chest pain        Elson AreasLeslie K Sharra Cayabyab, PA-C 02/13/14 2346  Lonia SkinnerLeslie K Round TopSofia, PA-C 02/13/14 2346

## 2014-02-13 NOTE — Discharge Instructions (Signed)
Abdominal Pain, Adult °Many things can cause abdominal pain. Usually, abdominal pain is not caused by a disease and will improve without treatment. It can often be observed and treated at home. Your health care provider will do a physical exam and possibly order blood tests and X-rays to help determine the seriousness of your pain. However, in many cases, more time must pass before a clear cause of the pain can be found. Before that point, your health care provider may not know if you need more testing or further treatment. °HOME CARE INSTRUCTIONS  °Monitor your abdominal pain for any changes. The following actions may help to alleviate any discomfort you are experiencing: °· Only take over-the-counter or prescription medicines as directed by your health care provider. °· Do not take laxatives unless directed to do so by your health care provider. °· Try a clear liquid diet (broth, tea, or water) as directed by your health care provider. Slowly move to a bland diet as tolerated. °SEEK MEDICAL CARE IF: °· You have unexplained abdominal pain. °· You have abdominal pain associated with nausea or diarrhea. °· You have pain when you urinate or have a bowel movement. °· You experience abdominal pain that wakes you in the night. °· You have abdominal pain that is worsened or improved by eating food. °· You have abdominal pain that is worsened with eating fatty foods. °SEEK IMMEDIATE MEDICAL CARE IF:  °· Your pain does not go away within 2 hours. °· You have a fever. °· You keep throwing up (vomiting). °· Your pain is felt only in portions of the abdomen, such as the right side or the left lower portion of the abdomen. °· You pass bloody or black tarry stools. °MAKE SURE YOU: °· Understand these instructions.   °· Will watch your condition.   °· Will get help right away if you are not doing well or get worse.   °Document Released: 06/06/2005 Document Revised: 06/17/2013 Document Reviewed: 05/06/2013 °[employer]® Patient  Information ©2014 [employer]. ° °Chest Pain (Nonspecific) °It is often hard to give a specific diagnosis for the cause of chest pain. There is always a chance that your pain could be related to something serious, such as a heart attack or a blood clot in the lungs. You need to follow up with your caregiver for further evaluation. °CAUSES  °· Heartburn. °· Pneumonia or bronchitis. °· Anxiety or stress. °· Inflammation around your heart (pericarditis) or lung (pleuritis or pleurisy). °· A blood clot in the lung. °· A collapsed lung (pneumothorax). It can develop suddenly on its own (spontaneous pneumothorax) or from injury (trauma) to the chest. °· Shingles infection (herpes zoster virus). °The chest wall is composed of bones, muscles, and cartilage. Any of these can be the source of the pain. °· The bones can be bruised by injury. °· The muscles or cartilage can be strained by coughing or overwork. °· The cartilage can be affected by inflammation and become sore (costochondritis). °DIAGNOSIS  °Lab tests or other studies, such as X-rays, electrocardiography, stress testing, or cardiac imaging, may be needed to find the cause of your pain.  °TREATMENT  °· Treatment depends on what may be causing your chest pain. Treatment may include: °· Acid blockers for heartburn. °· Anti-inflammatory medicine. °· Pain medicine for inflammatory conditions. °· Antibiotics if an infection is present. °· You may be advised to change lifestyle habits. This includes stopping smoking and avoiding alcohol, caffeine, and chocolate. °· You may be advised to keep your head raised (  elevated) when sleeping. This reduces the chance of acid going backward from your stomach into your esophagus. °· Most of the time, nonspecific chest pain will improve within 2 to 3 days with rest and mild pain medicine. °HOME CARE INSTRUCTIONS  °· If antibiotics were prescribed, take your antibiotics as directed. Finish them even if you start to feel  better. °· For the next few days, avoid physical activities that bring on chest pain. Continue physical activities as directed. °· Do not smoke. °· Avoid drinking alcohol. °· Only take over-the-counter or prescription medicine for pain, discomfort, or fever as directed by your caregiver. °· Follow your caregiver's suggestions for further testing if your chest pain does not go away. °· Keep any follow-up appointments you made. If you do not go to an appointment, you could develop lasting (chronic) problems with pain. If there is any problem keeping an appointment, you must call to reschedule. °SEEK MEDICAL CARE IF:  °· You think you are having problems from the medicine you are taking. Read your medicine instructions carefully. °· Your chest pain does not go away, even after treatment. °· You develop a rash with blisters on your chest. °SEEK IMMEDIATE MEDICAL CARE IF:  °· You have increased chest pain or pain that spreads to your arm, neck, jaw, back, or abdomen. °· You develop shortness of breath, an increasing cough, or you are coughing up blood. °· You have severe back or abdominal pain, feel nauseous, or vomit. °· You develop severe weakness, fainting, or chills. °· You have a fever. °THIS IS AN EMERGENCY. Do not wait to see if the pain will go away. Get medical help at once. Call your local emergency services (911 in [place] Do not drive yourself to the hospital. °MAKE SURE YOU:  °· Understand these instructions. °· Will watch your condition. °· Will get help right away if you are not doing well or get worse. °Document Released: 06/06/2005 Document Revised: 11/19/2011 Document Reviewed: 04/01/2008 °[employer]® Patient Information ©2014 [employer]. ° °

## 2014-02-13 NOTE — ED Notes (Addendum)
Right flank pain x9 months.  Worse yesterday.  No better today. Seen here yesterday for same. Today pt reports pain in mid sternal area, tender to touch, and pain across middle of back.  Pain is intermittent and aching.

## 2014-02-24 NOTE — ED Provider Notes (Signed)
Medical screening examination/treatment/procedure(s) were performed by non-physician practitioner and as supervising physician I was immediately available for consultation/collaboration.   EKG Interpretation   Date/Time:  Saturday February 13 2014 19:21:05 EDT Ventricular Rate:  61 PR Interval:  148 QRS Duration: 76 QT Interval:  420 QTC Calculation: 422 R Axis:   33 Text Interpretation:  Normal sinus rhythm with sinus arrhythmia Cannot  rule out Anterior infarct , age undetermined Abnormal ECG ED PHYSICIAN  INTERPRETATION AVAILABLE IN [facility] Confirmed by TEST, Record  ([id number]) on 02/15/2014 8:19:26 AM        Rolland PorterMark Delmas Faucett, MD 02/24/14 0002

## 2014-04-04 ENCOUNTER — Other Ambulatory Visit: Payer: Self-pay

## 2014-04-04 ENCOUNTER — Encounter (HOSPITAL_BASED_OUTPATIENT_CLINIC_OR_DEPARTMENT_OTHER): Payer: Self-pay | Admitting: Emergency Medicine

## 2014-04-04 DIAGNOSIS — R197 Diarrhea, unspecified: Secondary | ICD-10-CM | POA: Diagnosis not present

## 2014-04-04 DIAGNOSIS — R1012 Left upper quadrant pain: Secondary | ICD-10-CM | POA: Insufficient documentation

## 2014-04-04 DIAGNOSIS — Z87891 Personal history of nicotine dependence: Secondary | ICD-10-CM | POA: Diagnosis not present

## 2014-04-04 DIAGNOSIS — Z79899 Other long term (current) drug therapy: Secondary | ICD-10-CM | POA: Insufficient documentation

## 2014-04-04 DIAGNOSIS — R079 Chest pain, unspecified: Secondary | ICD-10-CM | POA: Insufficient documentation

## 2014-04-04 DIAGNOSIS — R071 Chest pain on breathing: Secondary | ICD-10-CM | POA: Diagnosis not present

## 2014-04-04 DIAGNOSIS — IMO0002 Reserved for concepts with insufficient information to code with codable children: Secondary | ICD-10-CM | POA: Diagnosis not present

## 2014-04-04 NOTE — ED Notes (Signed)
Pt states she ate around 18:30. Pt states that an hour ago she started having CP, burning sensation "like when you have gas in your stomach and throat." pt states pain also radiates to her back.

## 2014-04-05 ENCOUNTER — Emergency Department (HOSPITAL_BASED_OUTPATIENT_CLINIC_OR_DEPARTMENT_OTHER)
Admission: EM | Admit: 2014-04-05 | Discharge: 2014-04-05 | Disposition: A | Discharge status: Home / Self Care | Payer: Medicaid Other | Attending: Emergency Medicine | Admitting: Emergency Medicine

## 2014-04-05 DIAGNOSIS — R0789 Other chest pain: Secondary | ICD-10-CM

## 2014-04-05 MED ORDER — SUCRALFATE 1 G PO TABS
1.0000 g | ORAL_TABLET | Freq: Once | ORAL | Status: AC
  Administered 2014-04-05: 1 g via ORAL
  Filled 2014-04-05: qty 1

## 2014-04-05 NOTE — Discharge Instructions (Signed)

## 2014-04-05 NOTE — ED Provider Notes (Signed)
CSN: [id number]     Arrival date & time 04/04/14  2220 History  This chart was scribed for Sherry Seamen, MD by Sherry Clark, ED scribe. This patient was seen in room MH02/MH02 and the patient's care was started at 12:17 AM.    Chief Complaint  Patient presents with  . Chest Pain   HPI HPI Comments: Sherry Clark is a 43 y.o. female who presents to the Emergency Department complaining of intermittent burning chest pain; onset after eating approximately 6 hours ago. Initial onset months ago. States pain will resolves for a few weeks and then return for hours per episode. Describes sensation as having gas in the stomach and throat. Occasionally belches with pain and reports acid or sour taste in her mouth. Worse laying down; relief sitting up. Reports diarrhea for the past 2 days.Taking Tylenol PM nightly and Bentyl without relief. Ultrasound of the abdomen performed June 6th was normal. Has also had an endoscopy that was normal. Denies SOB, nausea, vomiting, or fever.  History reviewed. No pertinent past medical history. Past Surgical History  Procedure Laterality Date  . Knee surgery      Left Knee  . Tubal ligation     History reviewed. No pertinent family history. History  Substance Use Topics  . Smoking status: Former Smoker -- 0.50 packs/day for 10 years    Types: Cigarettes  . Smokeless tobacco: Not on file  . Alcohol Use: No   OB History   Grav Para Term Preterm Abortions TAB SAB Ect Mult Living                 Review of Systems  Constitutional: Negative for fever.  Respiratory: Negative for shortness of breath.   Cardiovascular: Positive for chest pain.  Gastrointestinal: Positive for abdominal pain and diarrhea. Negative for nausea and vomiting.   A complete 10 system review of systems was obtained and all systems are negative except as noted in the HPI and PMH.   Allergies  Review of patient's allergies indicates no known allergies.  Home Medications    Prior to Admission medications   Medication Sig Start Date End Date Taking? Authorizing Provider  famotidine (PEPCID) 20 MG tablet Take 1 tablet (20 mg total) by mouth 2 (two) times daily. 02/12/14  Yes Toy Baker, MD  fluticasone (FLONASE) 50 MCG/ACT nasal spray Place 2 sprays into both nostrils daily. 12/21/13  Yes Teressa Lower, NP  lansoprazole (PREVACID SOLUTAB) 30 MG disintegrating tablet Take 1 tablet (30 mg total) by mouth daily. 02/13/14  Yes Lonia Skinner Sofia, PA-C  Chlorphen-Phenyleph-ASA (ALKA-SELTZER PLUS COLD PO) Take 2 tablets by mouth daily as needed. Patient used this medication for cold symptoms.    Historical Provider, MD  ibuprofen (ADVIL,MOTRIN) 200 MG tablet Take 400 mg by mouth every 6 (six) hours as needed. Patient used this medication for pain.    Historical Provider, MD  ibuprofen (ADVIL,MOTRIN) 800 MG tablet Take 1 tablet (800 mg total) by mouth 3 (three) times daily. 10/01/13   April Smitty Cords, MD  oxyCODONE-acetaminophen (PERCOCET/ROXICET) 5-325 MG per tablet Take 1-2 tablets by mouth every 4 (four) hours as needed for moderate pain or severe pain. 02/12/14   Toy Baker, MD  ranitidine (ZANTAC) 150 MG tablet Take 1 tablet (150 mg total) by mouth 2 (two) times daily. 06/14/13   Ethelda Chick, MD  sucralfate (CARAFATE) 1 G tablet Take 1 tablet (1 g total) by mouth 4 (four) times daily. 02/12/14  Toy BakerAnthony T Allen, MD  traMADol (ULTRAM) 50 MG tablet Take 1 tablet (50 mg total) by mouth every 6 (six) hours as needed. 02/13/14   Elson AreasLeslie K Sofia, PA-C   Triage vitals: BP 148/97  Pulse 71  Temp(Src) 97.8 F (36.6 C) (Oral)  Resp 16  Ht 5\' 3"  (1.6 m)  Wt 173 lb (78.472 kg)  BMI 30.65 kg/m2  SpO2 100%  Physical Exam General: Well-developed, well-nourished female in no acute distress; appearance consistent with age of record HENT: normocephalic; atraumatic Eyes: pupils equal, round and reactive to light; extraocular muscles intact Neck: supple Heart: regular rate  and rhythm; no murmurs, rubs or gallops Lungs: clear to auscultation bilaterally Chest: Left upper chest wall tenderness Abdomen: soft; nondistended; LUQ abdominal tenderness; no masses or hepatosplenomegaly; bowel sounds present Extremities: No deformity; full range of motion; pulses normal Neurologic: Awake, alert and oriented; motor function intact in all extremities and symmetric; no facial droop Skin: Warm and dry Psychiatric: Normal mood and affect   ED Course  Procedures (including critical care time) DIAGNOSTIC STUDIES: Oxygen Saturation is 100% on room air, normal by my interpretation.    COORDINATION OF CARE: At 12:22 AM: Discussed treatment plan with patient which includes EKG and lab work. Patient agrees.    MDM  1:09 AM Equivocal improvement in left upper quadrant pain with Carafate. The patient's chest pain is reproducible and she has had multiple previous visits with a diagnosis of chest wall pain. She may also have a GI component. She was advised to continue Pepcid. She was advised to use NSAIDs for chest wall pain but with caution due to to possible stomach upset.   I personally performed the services described in this documentation, which was scribed in my presence. The recorded information has been reviewed and is accurate.     Sherry SeamenJohn L Joyceline Maiorino, MD 04/05/14 0110

## 2014-06-09 ENCOUNTER — Encounter (HOSPITAL_BASED_OUTPATIENT_CLINIC_OR_DEPARTMENT_OTHER): Payer: Self-pay | Admitting: Emergency Medicine

## 2014-06-09 ENCOUNTER — Emergency Department (HOSPITAL_BASED_OUTPATIENT_CLINIC_OR_DEPARTMENT_OTHER)
Admission: EM | Admit: 2014-06-09 | Discharge: 2014-06-09 | Disposition: A | Discharge status: Home / Self Care | Payer: Medicaid Other | Attending: Emergency Medicine | Admitting: Emergency Medicine

## 2014-06-09 DIAGNOSIS — R1110 Vomiting, unspecified: Secondary | ICD-10-CM | POA: Insufficient documentation

## 2014-06-09 DIAGNOSIS — R109 Unspecified abdominal pain: Secondary | ICD-10-CM | POA: Diagnosis not present

## 2014-06-09 DIAGNOSIS — R112 Nausea with vomiting, unspecified: Secondary | ICD-10-CM | POA: Insufficient documentation

## 2014-06-09 DIAGNOSIS — R059 Cough, unspecified: Secondary | ICD-10-CM | POA: Insufficient documentation

## 2014-06-09 DIAGNOSIS — Z87891 Personal history of nicotine dependence: Secondary | ICD-10-CM | POA: Insufficient documentation

## 2014-06-09 DIAGNOSIS — R509 Fever, unspecified: Secondary | ICD-10-CM | POA: Diagnosis not present

## 2014-06-09 DIAGNOSIS — IMO0002 Reserved for concepts with insufficient information to code with codable children: Secondary | ICD-10-CM | POA: Diagnosis not present

## 2014-06-09 DIAGNOSIS — R05 Cough: Secondary | ICD-10-CM | POA: Insufficient documentation

## 2014-06-09 DIAGNOSIS — IMO0001 Reserved for inherently not codable concepts without codable children: Secondary | ICD-10-CM | POA: Diagnosis not present

## 2014-06-09 DIAGNOSIS — R197 Diarrhea, unspecified: Secondary | ICD-10-CM | POA: Diagnosis not present

## 2014-06-09 MED ORDER — ONDANSETRON 8 MG PO TBDP
8.0000 mg | ORAL_TABLET | Freq: Once | ORAL | Status: AC
  Administered 2014-06-09: 8 mg via ORAL
  Filled 2014-06-09: qty 1

## 2014-06-09 MED ORDER — ONDANSETRON HCL 8 MG PO TABS: 8.0000 mg | ORAL_TABLET | Freq: Three times a day (TID) | ORAL | Status: DC | PRN

## 2014-06-09 NOTE — ED Provider Notes (Signed)
CSN: [id number]     Arrival date & time 06/09/14  [id number] History   First MD Initiated Contact with Patient 06/09/14 [phone]     Chief Complaint  Patient presents with  . Emesis  . Diarrhea      Patient is a 43 y.o. female presenting with vomiting and diarrhea. The history is provided by the patient.  Emesis Severity:  Moderate Timing:  Intermittent Progression:  Worsening Chronicity:  New Relieved by:  Nothing Worsened by:  Nothing tried Associated symptoms: abdominal pain, chills, cough, diarrhea, fever and myalgias   Risk factors: sick contacts   Risk factors: no travel to endemic areas   Diarrhea Associated symptoms: abdominal pain, chills, cough, fever, myalgias and vomiting   pt reports vomiting/diarrhea since yesterday - she reports multiple episodes She also reports mild abdominal cramping   PMH - none  Past Surgical History  Procedure Laterality Date  . Knee surgery      Left Knee  . Tubal ligation     No family history on file. History  Substance Use Topics  . Smoking status: Former Smoker -- 0.50 packs/day for 10 years    Types: Cigarettes  . Smokeless tobacco: Not on file  . Alcohol Use: No   OB History   Grav Para Term Preterm Abortions TAB SAB Ect Mult Living                 Review of Systems  Constitutional: Positive for fever and chills.  Respiratory: Positive for cough.   Gastrointestinal: Positive for vomiting, abdominal pain and diarrhea. Negative for blood in stool.  Musculoskeletal: Positive for myalgias.  All other systems reviewed and are negative.     Allergies  Review of patient's allergies indicates no known allergies.  Home Medications   Prior to Admission medications   Medication Sig Start Date End Date Taking? Authorizing Provider  acetaminophen (TYLENOL) 325 MG tablet Take 650 mg by mouth every 6 (six) hours as needed for fever.   Yes Historical Provider, MD  fluticasone (FLONASE) 50 MCG/ACT nasal spray Place 2 sprays into both  nostrils daily. 12/21/13   Teressa LowerVrinda Pickering, NP   BP 129/80  Pulse 68  Temp(Src) 97.8 F (36.6 C) (Oral)  Resp 18  Ht 5\' 3"  (1.6 m)  Wt 165 lb (74.844 kg)  BMI 29.24 kg/m2  SpO2 100%  LMP 05/12/2014 Physical Exam CONSTITUTIONAL: Well developed/well nourished HEAD: Normocephalic/atraumatic EYES: EOMI/PERRL, no icterus ENMT: Mucous membranes moist NECK: supple no meningeal signs SPINE:entire spine nontender CV: S1/S2 noted, no murmurs/rubs/gallops noted LUNGS: Lungs are clear to auscultation bilaterally, no apparent distress ABDOMEN: soft, nontender, no rebound or guarding GU:no cva tenderness NEURO: Pt is awake/alert, moves all extremitiesx4 EXTREMITIES: pulses normal, full ROM SKIN: warm, color normal PSYCH: no abnormalities of mood noted  ED Course  Procedures   Pt without focal abd tenderness here with likely viral gastroenteritis She is well appearing and nontoxic in appearance Appropriate for d/c home She is taking PO fluids MDM   Final diagnoses:  Vomiting and diarrhea    Nursing notes including past medical history and social history reviewed and considered in documentation     Joya Gaskinsonald W Dyshawn Cangelosi, MD 06/09/14 [phone]

## 2014-06-09 NOTE — ED Notes (Signed)
Pt having N/V/D with fever and body aches since yesterday.  No abdominal pain.  Pt has been around others with same symptoms.

## 2014-09-09 ENCOUNTER — Emergency Department (HOSPITAL_BASED_OUTPATIENT_CLINIC_OR_DEPARTMENT_OTHER)
Admission: EM | Admit: 2014-09-09 | Discharge: 2014-09-09 | Disposition: A | Discharge status: Home / Self Care | Payer: Medicaid Other | Attending: Emergency Medicine | Admitting: Emergency Medicine

## 2014-09-09 ENCOUNTER — Encounter (HOSPITAL_BASED_OUTPATIENT_CLINIC_OR_DEPARTMENT_OTHER): Payer: Self-pay | Admitting: Emergency Medicine

## 2014-09-09 DIAGNOSIS — Z87891 Personal history of nicotine dependence: Secondary | ICD-10-CM | POA: Diagnosis not present

## 2014-09-09 DIAGNOSIS — Z7951 Long term (current) use of inhaled steroids: Secondary | ICD-10-CM | POA: Insufficient documentation

## 2014-09-09 DIAGNOSIS — R0989 Other specified symptoms and signs involving the circulatory and respiratory systems: Secondary | ICD-10-CM | POA: Diagnosis present

## 2014-09-09 DIAGNOSIS — J069 Acute upper respiratory infection, unspecified: Secondary | ICD-10-CM | POA: Diagnosis not present

## 2014-09-09 MED ORDER — PREDNISONE 20 MG PO TABS: 40.0000 mg | ORAL_TABLET | Freq: Every day | ORAL | Status: DC

## 2014-09-09 MED ORDER — ALBUTEROL SULFATE HFA 108 (90 BASE) MCG/ACT IN AERS: 2.0000 | INHALATION_SPRAY | RESPIRATORY_TRACT | Status: DC | PRN

## 2014-09-09 MED ORDER — HYDROCODONE-HOMATROPINE 5-1.5 MG/5ML PO SYRP: 5.0000 mL | ORAL_SOLUTION | Freq: Four times a day (QID) | ORAL | Status: DC | PRN

## 2014-09-09 NOTE — ED Provider Notes (Signed)
CSN: [id number]     Arrival date & time 09/09/14  0010 History   First MD Initiated Contact with Patient 09/09/14 0028     Chief Complaint  Patient presents with  . Chest Congestion      (Consider location/radiation/quality/duration/timing/severity/associated sxs/prior Treatment) HPI Comments: Patient presents to the ER for evaluation of headache, sinus congestion, sneezing, chest congestion and cough. Symptoms ongoing for several days. Patient reports that the cough has become more severe. Cough is nonproductive but painful. She has not had any fever.   History reviewed. No pertinent past medical history. Past Surgical History  Procedure Laterality Date  . Knee surgery      Left Knee  . Tubal ligation     History reviewed. No pertinent family history. History  Substance Use Topics  . Smoking status: Former Smoker -- 0.50 packs/day for 10 years    Types: Cigarettes  . Smokeless tobacco: Not on file  . Alcohol Use: No   OB History    No data available     Review of Systems  Constitutional: Negative for fever.  HENT: Positive for congestion.   Respiratory: Positive for cough.   All other systems reviewed and are negative.     Allergies  Review of patient's allergies indicates no known allergies.  Home Medications   Prior to Admission medications   Medication Sig Start Date End Date Taking? Authorizing Provider  acetaminophen (TYLENOL) 325 MG tablet Take 650 mg by mouth every 6 (six) hours as needed for fever.    Historical Provider, MD  fluticasone (FLONASE) 50 MCG/ACT nasal spray Place 2 sprays into both nostrils daily. 12/21/13   Teressa LowerVrinda Pickering, NP  ondansetron (ZOFRAN) 8 MG tablet Take 1 tablet (8 mg total) by mouth every 8 (eight) hours as needed for nausea. 06/09/14   Joya Gaskinsonald W Wickline, MD   BP 151/88 mmHg  Pulse 64  Temp(Src) 97.8 F (36.6 C) (Oral)  Resp 18  Ht 5\' 3"  (1.6 m)  Wt 172 lb (78.019 kg)  BMI 30.48 kg/m2  SpO2 98%  LMP 09/08/2014 Physical  Exam  Constitutional: She is oriented to person, place, and time. She appears well-developed and well-nourished. No distress.  HENT:  Head: Normocephalic and atraumatic.  Right Ear: Hearing normal.  Left Ear: Hearing normal.  Nose: Nose normal.  Mouth/Throat: Oropharynx is clear and moist and mucous membranes are normal.  Eyes: Conjunctivae and EOM are normal. Pupils are equal, round, and reactive to light.  Neck: Normal range of motion. Neck supple.  Cardiovascular: Regular rhythm, S1 normal and S2 normal.  Exam reveals no gallop and no friction rub.   No murmur heard. Pulmonary/Chest: Effort normal and breath sounds normal. No respiratory distress. She exhibits no tenderness.  Abdominal: Soft. Normal appearance and bowel sounds are normal. There is no hepatosplenomegaly. There is no tenderness. There is no rebound, no guarding, no tenderness at McBurney's point and negative Murphy's sign. No hernia.  Musculoskeletal: Normal range of motion.  Neurological: She is alert and oriented to person, place, and time. She has normal strength. No cranial nerve deficit or sensory deficit. Coordination normal. GCS eye subscore is 4. GCS verbal subscore is 5. GCS motor subscore is 6.  Skin: Skin is warm, dry and intact. No rash noted. No cyanosis.  Psychiatric: She has a normal mood and affect. Her speech is normal and behavior is normal. Thought content normal.  Nursing note and vitals reviewed.   ED Course  Procedures (including critical care time) Labs Review  Labs Reviewed - No data to display  Imaging Review No results found.   EKG Interpretation None      MDM   Final diagnoses:  None   upper respiratory infection   Patient presents to the ER for evaluation of upper respiratory infection symptoms. Patient's oxygenation is normal and all vital signs are normal. Lungs are clear, no wheezing and no clinical concern for pneumonia. Patient experiencing significant pain with cough,  possible early pleurisy. Will treat empirically. Does not require antibiotics.    Gilda Creasehristopher J. Pollina, MD 09/09/14 [phone]

## 2014-09-09 NOTE — Discharge Instructions (Signed)
Upper Respiratory Infection, Adult °An upper respiratory infection (URI) is also sometimes known as the common cold. The upper respiratory tract includes the nose, sinuses, throat, trachea, and bronchi. Bronchi are the airways leading to the lungs. Most people improve within 1 week, but symptoms can last up to 2 weeks. A residual cough may last even longer.  °CAUSES °Many different viruses can infect the tissues lining the upper respiratory tract. The tissues become irritated and inflamed and often become very moist. Mucus production is also common. A cold is contagious. You can easily spread the virus to others by oral contact. This includes kissing, sharing a glass, coughing, or sneezing. Touching your mouth or nose and then touching a surface, which is then touched by another person, can also spread the virus. °SYMPTOMS  °Symptoms typically develop 1 to 3 days after you come in contact with a cold virus. Symptoms vary from person to person. They may include: °· Runny nose. °· Sneezing. °· Nasal congestion. °· Sinus irritation. °· Sore throat. °· Loss of voice (laryngitis). °· Cough. °· Fatigue. °· Muscle aches. °· Loss of appetite. °· Headache. °· Low-grade fever. °DIAGNOSIS  °You might diagnose your own cold based on familiar symptoms, since most people get a cold 2 to 3 times a year. Your caregiver can confirm this based on your exam. Most importantly, your caregiver can check that your symptoms are not due to another disease such as strep throat, sinusitis, pneumonia, asthma, or epiglottitis. Blood tests, throat tests, and X-rays are not necessary to diagnose a common cold, but they may sometimes be helpful in excluding other more serious diseases. Your caregiver will decide if any further tests are required. °RISKS AND COMPLICATIONS  °You may be at risk for a more severe case of the common cold if you smoke cigarettes, have chronic heart disease (such as heart failure) or lung disease (such as asthma), or if  you have a weakened immune system. The very young and very old are also at risk for more serious infections. Bacterial sinusitis, middle ear infections, and bacterial pneumonia can complicate the common cold. The common cold can worsen asthma and chronic obstructive pulmonary disease (COPD). Sometimes, these complications can require emergency medical care and may be life-threatening. °PREVENTION  °The best way to protect against getting a cold is to practice good hygiene. Avoid oral or hand contact with people with cold symptoms. Wash your hands often if contact occurs. There is no clear evidence that vitamin C, vitamin E, echinacea, or exercise reduces the chance of developing a cold. However, it is always recommended to get plenty of rest and practice good nutrition. °TREATMENT  °Treatment is directed at relieving symptoms. There is no cure. Antibiotics are not effective, because the infection is caused by a virus, not by bacteria. Treatment may include: °· Increased fluid intake. Sports drinks offer valuable electrolytes, sugars, and fluids. °· Breathing heated mist or steam (vaporizer or shower). °· Eating chicken soup or other clear broths, and maintaining good nutrition. °· Getting plenty of rest. °· Using gargles or lozenges for comfort. °· Controlling fevers with ibuprofen or acetaminophen as directed by your caregiver. °· Increasing usage of your inhaler if you have asthma. °Zinc gel and zinc lozenges, taken in the first 24 hours of the common cold, can shorten the duration and lessen the severity of symptoms. Pain medicines may help with fever, muscle aches, and throat pain. A variety of non-prescription medicines are available to treat congestion and runny nose. Your caregiver   can make recommendations and may suggest nasal or lung inhalers for other symptoms.  HOME CARE INSTRUCTIONS   Only take over-the-counter or prescription medicines for pain, discomfort, or fever as directed by your  caregiver.  Use a warm mist humidifier or inhale steam from a shower to increase air moisture. This may keep secretions moist and make it easier to breathe.  Drink enough water and fluids to keep your urine clear or pale yellow.  Rest as needed.  Return to work when your temperature has returned to normal or as your caregiver advises. You may need to stay home longer to avoid infecting others. You can also use a face mask and careful hand washing to prevent spread of the virus. SEEK MEDICAL CARE IF:   After the first few days, you feel you are getting worse rather than better.  You need your caregiver's advice about medicines to control symptoms.  You develop chills, worsening shortness of breath, or brown or red sputum. These may be signs of pneumonia.  You develop yellow or brown nasal discharge or pain in the face, especially when you bend forward. These may be signs of sinusitis.  You develop a fever, swollen neck glands, pain with swallowing, or white areas in the back of your throat. These may be signs of strep throat. SEEK IMMEDIATE MEDICAL CARE IF:   You have a fever.  You develop severe or persistent headache, ear pain, sinus pain, or chest pain.  You develop wheezing, a prolonged cough, cough up blood, or have a change in your usual mucus (if you have chronic lung disease).  You develop sore muscles or a stiff neck. Document Released: 02/20/2001 Document Revised: 11/19/2011 Document Reviewed: 12/02/2013 [facility] Patient Information 2015 [place], [place]. This information is not intended to replace advice given to you by your health care provider. Make sure you discuss any questions you have with your health care provider. Pleurisy Pleurisy is an inflammation and swelling of the lining of the lungs (pleura). Because of this inflammation, it hurts to breathe. It can be aggravated by coughing, laughing, or deep breathing. Pleurisy is often caused by an underlying infection or  disease.  HOME CARE INSTRUCTIONS  Monitor your pleurisy for any changes. The following actions may help to alleviate any discomfort you are experiencing:  Medicine may help with pain. Only take over-the-counter or prescription medicines for pain, discomfort, or fever as directed by your health care provider.  Only take antibiotic medicine as directed. Make sure to finish it even if you start to feel better. SEEK MEDICAL CARE IF:   Your pain is not controlled with medicine or is increasing.  You have an increase in pus-like (purulent) secretions brought up with coughing. SEEK IMMEDIATE MEDICAL CARE IF:   You have blue or dark lips, fingernails, or toenails.  You are coughing up blood.  You have increased difficulty breathing.  You have continuing pain unrelieved by medicine or pain lasting more than 1 week.  You have pain that radiates into your neck, arms, or jaw.  You develop increased shortness of breath or wheezing.  You develop a fever, rash, vomiting, fainting, or other serious symptoms. MAKE SURE YOU:  Understand these instructions.   Will watch your condition.   Will get help right away if you are not doing well or get worse.  Document Released: 08/27/2005 Document Revised: 04/29/2013 Document Reviewed: 02/08/2013 [facility] Patient Information 2015 [place], [place]. This information is not intended to replace advice given to you by your  health care provider. Make sure you discuss any questions you have with your health care provider. ° °

## 2014-09-09 NOTE — ED Notes (Signed)
Pt states she has had cold and sinus problems for several days and now her chest is hurting her to breath.

## 2014-09-26 ENCOUNTER — Encounter (HOSPITAL_BASED_OUTPATIENT_CLINIC_OR_DEPARTMENT_OTHER): Payer: Self-pay | Admitting: *Deleted

## 2014-09-26 ENCOUNTER — Emergency Department (HOSPITAL_BASED_OUTPATIENT_CLINIC_OR_DEPARTMENT_OTHER): Payer: Medicaid Other

## 2014-09-26 ENCOUNTER — Emergency Department (HOSPITAL_BASED_OUTPATIENT_CLINIC_OR_DEPARTMENT_OTHER)
Admission: EM | Admit: 2014-09-26 | Discharge: 2014-09-26 | Disposition: A | Discharge status: Home / Self Care | Payer: Medicaid Other | Attending: Emergency Medicine | Admitting: Emergency Medicine

## 2014-09-26 DIAGNOSIS — R0981 Nasal congestion: Secondary | ICD-10-CM | POA: Diagnosis present

## 2014-09-26 DIAGNOSIS — Z7951 Long term (current) use of inhaled steroids: Secondary | ICD-10-CM | POA: Diagnosis not present

## 2014-09-26 DIAGNOSIS — Z79899 Other long term (current) drug therapy: Secondary | ICD-10-CM | POA: Insufficient documentation

## 2014-09-26 DIAGNOSIS — Z7952 Long term (current) use of systemic steroids: Secondary | ICD-10-CM | POA: Diagnosis not present

## 2014-09-26 DIAGNOSIS — R05 Cough: Secondary | ICD-10-CM

## 2014-09-26 DIAGNOSIS — R69 Illness, unspecified: Secondary | ICD-10-CM

## 2014-09-26 DIAGNOSIS — Z87891 Personal history of nicotine dependence: Secondary | ICD-10-CM | POA: Insufficient documentation

## 2014-09-26 DIAGNOSIS — J111 Influenza due to unidentified influenza virus with other respiratory manifestations: Secondary | ICD-10-CM | POA: Insufficient documentation

## 2014-09-26 DIAGNOSIS — R059 Cough, unspecified: Secondary | ICD-10-CM

## 2014-09-26 MED ORDER — OSELTAMIVIR PHOSPHATE 75 MG PO CAPS: 75.0000 mg | ORAL_CAPSULE | Freq: Two times a day (BID) | ORAL | Status: DC

## 2014-09-26 MED ORDER — ONDANSETRON HCL 4 MG PO TABS: 4.0000 mg | ORAL_TABLET | Freq: Four times a day (QID) | ORAL | Status: DC

## 2014-09-26 MED ORDER — NAPROXEN 500 MG PO TABS: 500.0000 mg | ORAL_TABLET | Freq: Two times a day (BID) | ORAL | Status: DC

## 2014-09-26 MED ORDER — KETOROLAC TROMETHAMINE 60 MG/2ML IM SOLN
60.0000 mg | Freq: Once | INTRAMUSCULAR | Status: AC
  Administered 2014-09-26: 60 mg via INTRAMUSCULAR
  Filled 2014-09-26: qty 2

## 2014-09-26 MED ORDER — ONDANSETRON 4 MG PO TBDP
4.0000 mg | ORAL_TABLET | Freq: Once | ORAL | Status: AC
  Administered 2014-09-26: 4 mg via ORAL
  Filled 2014-09-26: qty 1

## 2014-09-26 NOTE — ED Notes (Signed)
No adverse effects to IM injection.  

## 2014-09-26 NOTE — ED Provider Notes (Signed)
CSN: [id number]     Arrival date & time 09/26/14  1449 History   First MD Initiated Contact with Patient 09/26/14 1459     Chief Complaint  Patient presents with  . Nasal Congestion  . Generalized Body Aches     (Consider location/radiation/quality/duration/timing/severity/associated sxs/prior Treatment) HPI  Pt presenting with c/o cough, nasal congestion, body aches, sore throat.  Pt states symptoms began 2 days ago.  She has also developed fever/chills yesterday. Has also had some vomiting and diarrhea.  Emesis nonbloody and nonbilious. Diarrhea is watery and without blood or mucous.  Pt has not had any recent travel.  Her son recently had the flu.  She has not received her flu shot this season.  She has been able to keep down some liquids.  She has tried theraflu and alka seltzer cold without relief.  There are no other associated systemic symptoms, there are no other alleviating or modifying factors.   History reviewed. No pertinent past medical history. Past Surgical History  Procedure Laterality Date  . Knee surgery      Left Knee  . Tubal ligation     No family history on file. History  Substance Use Topics  . Smoking status: Former Smoker -- 0.50 packs/day for 10 years    Types: Cigarettes  . Smokeless tobacco: Never Used  . Alcohol Use: No   OB History    No data available     Review of Systems  ROS reviewed and all otherwise negative except for mentioned in HPI    Allergies  Review of patient's allergies indicates no known allergies.  Home Medications   Prior to Admission medications   Medication Sig Start Date End Date Taking? Authorizing Provider  acetaminophen (TYLENOL) 325 MG tablet Take 650 mg by mouth every 6 (six) hours as needed for fever.    Historical Provider, MD  albuterol (PROVENTIL HFA;VENTOLIN HFA) 108 (90 BASE) MCG/ACT inhaler Inhale 2 puffs into the lungs every 4 (four) hours as needed for wheezing or shortness of breath. 09/09/14   Gilda Creasehristopher  J. Pollina, MD  fluticasone (FLONASE) 50 MCG/ACT nasal spray Place 2 sprays into both nostrils daily. 12/21/13   Teressa LowerVrinda Pickering, NP  HYDROcodone-homatropine (HYCODAN) 5-1.5 MG/5ML syrup Take 5 mLs by mouth every 6 (six) hours as needed for cough. 09/09/14   Gilda Creasehristopher J. Pollina, MD  naproxen (NAPROSYN) 500 MG tablet Take 1 tablet (500 mg total) by mouth 2 (two) times daily. 09/26/14   Ethelda ChickMartha K Linker, MD  ondansetron (ZOFRAN) 4 MG tablet Take 1 tablet (4 mg total) by mouth every 6 (six) hours. 09/26/14   Ethelda ChickMartha K Linker, MD  oseltamivir (TAMIFLU) 75 MG capsule Take 1 capsule (75 mg total) by mouth every 12 (twelve) hours. 09/26/14   Ethelda ChickMartha K Linker, MD  predniSONE (DELTASONE) 20 MG tablet Take 2 tablets (40 mg total) by mouth daily with breakfast. 09/09/14   Gilda Creasehristopher J. Pollina, MD   BP 143/86 mmHg  Pulse 88  Temp(Src) 98.3 F (36.8 C) (Oral)  Resp 18  Ht 5\' 3"  (1.6 m)  Wt 195 lb (88.451 kg)  BMI 34.55 kg/m2  SpO2 100%  LMP 09/08/2014  Vitals reviewed Physical Exam  Physical Examination: General appearance - alert, well appearing, and in no distress Mental status - alert, oriented to person, place, and time Eyes - no conjunctival injection, no scleral icterus Mouth - mucous membranes moist, pharynx normal without lesions Neck - supple, no significant adenopathy Chest - clear to auscultation, no wheezes,  rales or rhonchi, symmetric air entry Heart - normal rate, regular rhythm, normal S1, S2, no murmurs, rubs, clicks or gallops Abdomen - soft, nontender, nondistended, no masses or organomegaly Extremities - peripheral pulses normal, no pedal edema, no clubbing or cyanosis Skin - normal coloration and turgor, no rashes  ED Course  Procedures (including critical care time) Labs Review Labs Reviewed - No data to display  Imaging Review Dg Chest 2 View  09/26/2014   CLINICAL DATA:  Cough, headache, body aches since Friday  EXAM: CHEST  2 VIEW  COMPARISON:  02/13/2014  FINDINGS:  The heart size and mediastinal contours are within normal limits. Both lungs are clear. The visualized skeletal structures are unremarkable.  IMPRESSION: No active cardiopulmonary disease.   Electronically Signed   By: Charlett Nose M.D.   On: 09/26/2014 15:17     EKG Interpretation None      MDM   Final diagnoses:  Cough  Influenza-like illness    Pt presenting with c/o cough, body aches, congestion, sore throat, some vomiting and diarrhea as well.  Pt feels somewhat improved after toradol for body aches.  zofran for nausea, pt is able to tolerate po fluids in the ED prior to discharge.  CXR reassuring.   Xray images reviewed and interpreted by me as well.  Suspect viral/flu- like illness- will start on tamiflu as symptoms started less than 48 hours ago.  Discharged with strict return precautions.  Pt agreeable with plan.  Nursing notes including past medical history and social history reviewed and considered in documentation     Ethelda Chick, MD 09/26/14 1946

## 2014-09-26 NOTE — ED Notes (Signed)
Coughing and sneezing Friday- congestion, headache, facial pain and body aches today- also reports n/v x 3 and 2 loose stools today

## 2014-09-26 NOTE — Discharge Instructions (Signed)
Return to the ED with any concerns including difficulty breathing, chest pain, vomiting and not able to keep down liquids, decreased level of alertness/lethargy, or any other alarming symptoms °

## 2014-10-06 ENCOUNTER — Emergency Department (HOSPITAL_BASED_OUTPATIENT_CLINIC_OR_DEPARTMENT_OTHER): Payer: Worker's Compensation

## 2014-10-06 ENCOUNTER — Encounter (HOSPITAL_BASED_OUTPATIENT_CLINIC_OR_DEPARTMENT_OTHER): Payer: Self-pay | Admitting: Emergency Medicine

## 2014-10-06 ENCOUNTER — Emergency Department (HOSPITAL_BASED_OUTPATIENT_CLINIC_OR_DEPARTMENT_OTHER)
Admission: EM | Admit: 2014-10-06 | Discharge: 2014-10-06 | Disposition: A | Discharge status: Home / Self Care | Payer: Worker's Compensation | Attending: Emergency Medicine | Admitting: Emergency Medicine

## 2014-10-06 DIAGNOSIS — S39012A Strain of muscle, fascia and tendon of lower back, initial encounter: Secondary | ICD-10-CM | POA: Diagnosis not present

## 2014-10-06 DIAGNOSIS — Y92481 Parking lot as the place of occurrence of the external cause: Secondary | ICD-10-CM | POA: Insufficient documentation

## 2014-10-06 DIAGNOSIS — W1839XA Other fall on same level, initial encounter: Secondary | ICD-10-CM | POA: Insufficient documentation

## 2014-10-06 DIAGNOSIS — Z87891 Personal history of nicotine dependence: Secondary | ICD-10-CM | POA: Insufficient documentation

## 2014-10-06 DIAGNOSIS — Y9389 Activity, other specified: Secondary | ICD-10-CM | POA: Insufficient documentation

## 2014-10-06 DIAGNOSIS — S6992XA Unspecified injury of left wrist, hand and finger(s), initial encounter: Secondary | ICD-10-CM | POA: Diagnosis not present

## 2014-10-06 DIAGNOSIS — S300XXA Contusion of lower back and pelvis, initial encounter: Secondary | ICD-10-CM | POA: Diagnosis not present

## 2014-10-06 DIAGNOSIS — W19XXXA Unspecified fall, initial encounter: Secondary | ICD-10-CM

## 2014-10-06 DIAGNOSIS — Y990 Civilian activity done for income or pay: Secondary | ICD-10-CM | POA: Insufficient documentation

## 2014-10-06 DIAGNOSIS — S3992XA Unspecified injury of lower back, initial encounter: Secondary | ICD-10-CM | POA: Diagnosis present

## 2014-10-06 MED ORDER — IBUPROFEN 800 MG PO TABS
800.0000 mg | ORAL_TABLET | Freq: Once | ORAL | Status: AC
  Administered 2014-10-06: 800 mg via ORAL
  Filled 2014-10-06: qty 1

## 2014-10-06 MED ORDER — IBUPROFEN 600 MG PO TABS: 600.0000 mg | ORAL_TABLET | Freq: Four times a day (QID) | ORAL | Status: DC | PRN

## 2014-10-06 MED ORDER — CYCLOBENZAPRINE HCL 10 MG PO TABS
10.0000 mg | ORAL_TABLET | Freq: Once | ORAL | Status: AC
  Administered 2014-10-06: 10 mg via ORAL
  Filled 2014-10-06: qty 1

## 2014-10-06 MED ORDER — CYCLOBENZAPRINE HCL 10 MG PO TABS: 10.0000 mg | ORAL_TABLET | Freq: Two times a day (BID) | ORAL | Status: DC | PRN

## 2014-10-06 NOTE — ED Provider Notes (Signed)
CSN: [id number]     Arrival date & time 10/06/14  0720 History   First MD Initiated Contact with Patient 10/06/14 [phone]     Chief Complaint  Patient presents with  . Fall     (Consider location/radiation/quality/duration/timing/severity/associated sxs/prior Treatment) HPI The patient slipped on ice in the parking lot at work. She reports her feet went out from underneath her and caused her to fall landing on her buttock and lower back. She reports that she did break the fall somewhat with her left wrist. She reports most of her pain is in her lower back and her buttocks. Pain is worse by twisting and movements. No weakness or numbness in the legs. There was no associated head injury, chest injury or abdominal injury. The patient is ambulatory without weakness or incoordination. No past medical history on file. Past Surgical History  Procedure Laterality Date  . Knee surgery      Left Knee  . Tubal ligation     No family history on file. History  Substance Use Topics  . Smoking status: Former Smoker -- 0.50 packs/day for 10 years    Types: Cigarettes  . Smokeless tobacco: Never Used  . Alcohol Use: No   OB History    No data available     Review of Systems  10 Systems reviewed and are negative for acute change except as noted in the HPI.   Allergies  Review of patient's allergies indicates no known allergies.  Home Medications   Prior to Admission medications   Medication Sig Start Date End Date Taking? Authorizing Provider  cyclobenzaprine (FLEXERIL) 10 MG tablet Take 1 tablet (10 mg total) by mouth 2 (two) times daily as needed for muscle spasms. 10/06/14   Arby Barrette, MD  ibuprofen (ADVIL,MOTRIN) 600 MG tablet Take 1 tablet (600 mg total) by mouth every 6 (six) hours as needed. 10/06/14   Arby Barrette, MD   BP 130/86 mmHg  Pulse 64  Temp(Src) 98.2 F (36.8 C) (Oral)  Resp 18  Ht  (1.6 m)  Wt 190 lb (86.183 kg)  BMI 33.67 kg/m2  SpO2 100%  LMP  09/02/2014 Physical Exam  Constitutional: She is oriented to person, place, and time. She appears well-developed and well-nourished.  HENT:  Head: Normocephalic and atraumatic.  Eyes: EOM are normal. Pupils are equal, round, and reactive to light.  Neck: Neck supple.  Cardiovascular: Normal rate, regular rhythm, normal heart sounds and intact distal pulses.   Pulmonary/Chest: Effort normal and breath sounds normal.  Abdominal: Soft. Bowel sounds are normal. She exhibits no distension. There is no tenderness.  Musculoskeletal: Normal range of motion. She exhibits no edema.  The patient endorses tenderness to palpation from the lumbar spine at about L2 down to the coccyx. There is no palpable abnormality or visual abnormality. The patient endorses some tenderness to range of motion of the left wrist. The wrist however is normal to visual inspection without deformity or swelling. She has normal intact range of motion and normal neurovascular function.  Neurological: She is alert and oriented to person, place, and time. She has normal strength. No cranial nerve deficit. She exhibits normal muscle tone. Coordination normal. GCS eye subscore is 4. GCS verbal subscore is 5. GCS motor subscore is 6.  Lower externally strength is normal and intact.  Skin: Skin is warm, dry and intact.  Psychiatric: She has a normal mood and [id number]  ED Course  Procedures (including critical care time) Labs Review Labs  Reviewed - No data to display  Imaging Review Dg Lumbar Spine Complete  10/06/2014   CLINICAL DATA:  Fall this morning, slipped on ice, landed on left side, left side lower back pain  EXAM: LUMBAR SPINE - COMPLETE 4+ VIEW  COMPARISON:  None.  FINDINGS: Five views of the lumbar spine submitted. No acute fracture or subluxation. Alignment, disc spaces and vertebral body heights are preserved.  IMPRESSION: Negative.   Electronically Signed   By: Natasha MeadLiviu  Pop M.D.   On: 10/06/2014 08:06     EKG  Interpretation None      MDM   Final diagnoses:  Low back strain, initial encounter  Coccyx contusion, initial encounter  Fall, initial encounter   The patient has fallen onto her buttocks and back. At this time there is no evidence of neurologic injury nor acute fracture. Patient will be given instructions for symptomatic treatment and follow-up plan.    Arby BarretteMarcy Trayvond Viets, MD 10/06/14 [phone]

## 2014-10-06 NOTE — ED Notes (Signed)
Pt slipped on ice and fell onto buttocks.  Pt c/o pain to buttocks and lower back.  No head injury or loc.

## 2014-10-06 NOTE — Discharge Instructions (Signed)
Lumbosacral Strain Lumbosacral strain is a strain of any of the parts that make up your lumbosacral vertebrae. Your lumbosacral vertebrae are the bones that make up the lower third of your backbone. Your lumbosacral vertebrae are held together by muscles and tough, fibrous tissue (ligaments).  CAUSES  A sudden blow to your back can cause lumbosacral strain. Also, anything that causes an excessive stretch of the muscles in the low back can cause this strain. This is typically seen when people exert themselves strenuously, fall, lift heavy objects, bend, or crouch repeatedly. RISK FACTORS  Physically demanding work.  Participation in pushing or pulling sports or sports that require a sudden twist of the back (tennis, golf, baseball).  Weight lifting.  Excessive lower back curvature.  Forward-tilted pelvis.  Weak back or abdominal muscles or both.  Tight hamstrings. SIGNS AND SYMPTOMS  Lumbosacral strain may cause pain in the area of your injury or pain that moves (radiates) down your leg.  DIAGNOSIS Your health care provider can often diagnose lumbosacral strain through a physical exam. In some cases, you may need tests such as X-ray exams.  TREATMENT  Treatment for your lower back injury depends on many factors that your clinician will have to evaluate. However, most treatment will include the use of anti-inflammatory medicines. HOME CARE INSTRUCTIONS   Avoid hard physical activities (tennis, racquetball, waterskiing) if you are not in proper physical condition for it. This may aggravate or create problems.  If you have a back problem, avoid sports requiring sudden body movements. Swimming and walking are generally safer activities.  Maintain good posture.  Maintain a healthy weight.  For acute conditions, you may put ice on the injured area.  Put ice in a plastic bag.  Place a towel between your skin and the bag.  Leave the ice on for 20 minutes, 2-3 times a day.  When the  low back starts healing, stretching and strengthening exercises may be recommended. SEEK MEDICAL CARE IF:  Your back pain is getting worse.  You experience severe back pain not relieved with medicines. SEEK IMMEDIATE MEDICAL CARE IF:   You have numbness, tingling, weakness, or problems with the use of your arms or legs.  There is a change in bowel or bladder control.  You have increasing pain in any area of the body, including your belly (abdomen).  You notice shortness of breath, dizziness, or feel faint.  You feel sick to your stomach (nauseous), are throwing up (vomiting), or become sweaty.  You notice discoloration of your toes or legs, or your feet get very cold. MAKE SURE YOU:   Understand these instructions.  Will watch your condition.  Will get help right away if you are not doing well or get worse. Document Released: 06/06/2005 Document Revised: 09/01/2013 Document Reviewed: 04/15/2013 [facility] Patient Information 2015 [place], [place]. This information is not intended to replace advice given to you by your health care provider. Make sure you discuss any questions you have with your health care provider. Tailbone Injury The tailbone (coccyx) is the small bone at the lower end of the spine. A tailbone injury may involve stretched ligaments, bruising, or a broken bone (fracture). Women are more vulnerable to this injury due to having a wider pelvis. CAUSES  This type of injury typically occurs from falling and landing on the tailbone. Repeated strain or friction from actions such as rowing and bicycling may also injure the area. The tailbone can be injured during childbirth. Infections or tumors may also press  on the tailbone and cause pain. Sometimes, the cause of injury is unknown. SYMPTOMS   Bruising.  Pain when sitting.  Painful bowel movements.  In women, pain during intercourse. DIAGNOSIS  Your caregiver can diagnose a tailbone injury based on your symptoms and a  physical exam. X-rays may be taken if a fracture is suspected. Your caregiver may also use an MRI scan imaging test to evaluate your symptoms. TREATMENT  Your caregiver may prescribe medicines to help relieve your pain. Most tailbone injuries heal on their own in 4 to 6 weeks. However, if the injury is caused by an infection or tumor, the recovery period may vary. PREVENTION  Wear appropriate padding and sports gear when bicycling and rowing. This can help prevent an injury from repeated strain or friction. HOME CARE INSTRUCTIONS   Put ice on the injured area.  Put ice in a plastic bag.  Place a towel between your skin and the bag.  Leave the ice on for 15-20 minutes, every hour while awake for the first 1 to 2 days.  Sit on a large, rubber or inflated ring or cushion to ease your pain. Lean forward when sitting to help decrease discomfort.  Avoid sitting for long periods of time.  Increase your activity as the pain allows.  Only take over-the-counter or prescription medicines for pain, discomfort, or fever as directed by your caregiver.  You may use stool softeners if it is painful to have a bowel movement, or as directed by your caregiver.  Eat a diet with plenty of fiber to help prevent constipation.  Keep all follow-up appointments as directed by your caregiver. SEEK MEDICAL CARE IF:   Your pain becomes worse.  Your bowel movements cause a great deal of discomfort.  You are unable to have a bowel movement.  You have a fever. MAKE SURE YOU:  Understand these instructions.  Will watch your condition.  Will get help right away if you are not doing well or get worse. Document Released: 08/24/2000 Document Revised: 11/19/2011 Document Reviewed: 03/22/2011 [facility] Patient Information 2015 [place], [place]. This information is not intended to replace advice given to you by your health care provider. Make sure you discuss any questions you have with your health care  provider.  Emergency Department Resource Guide 1) Find a Doctor and Pay Out of Pocket Although you won't have to find out who is covered by your insurance plan, it is a good idea to ask around and get recommendations. You will then need to call the office and see if the doctor you have chosen will accept you as a new patient and what types of options they offer for patients who are self-pay. Some doctors offer discounts or will set up payment plans for their patients who do not have insurance, but you will need to ask so you aren't surprised when you get to your appointment.  2) Contact Your Local Health Department Not all health departments have doctors that can see patients for sick visits, but many do, so it is worth a call to see if yours does. If you don't know where your local health department is, you can check in your phone book. The CDC also has a tool to help you locate your state's health department, and many state websites also have listings of all of their local health departments.  3) Find a Walk-in Clinic If your illness is not likely to be very severe or complicated, you may want to try a walk in  clinic. These are popping up all over the country in pharmacies, drugstores, and shopping centers. They're usually staffed by nurse practitioners or physician assistants that have been trained to treat common illnesses and complaints. They're usually fairly quick and inexpensive. However, if you have serious medical issues or chronic medical problems, these are probably not your best option.  No Primary Care Doctor: - Call Health Connect at  [phone] - they can help you locate a primary care doctor that  accepts your insurance, provides certain services, etc. - Physician Referral Service- [phone]  Chronic Pain Problems: Organization         Address  Phone   Notes  [facility]  [phone] Patients need to be referred by their primary care doctor.   Medication  Assistance: Organization         Address  Phone   Notes  [facility] Medication [facility] [address] [place] [place], [place] [address] [phone] --Must be a resident of [facility] -- Must have NO insurance coverage whatsoever (no Medicaid/ Medicare, etc.) -- The pt. MUST have a primary care doctor that directs their care regularly and follows them in the community   [facility]  [phone]   [employer]  [phone]    Agencies that provide inexpensive medical care: Organization         Address  Phone   Notes  [facility]  [phone]   [facility]    [phone]   [facility] [address] [place], [place] [address] [phone]   [facility] [address], [place] [phone]   Planned Parenthood    [phone]   [facility]    [phone]   [facility]  [address], [place] Phone:  [phone], Fax:  [phone] Hours of Operation:  9 am - 6 pm, M-F.  Also accepts Medicaid/Medicare and self-pay.  [facility]  [address], [place] Phone: [phone], Fax: [phone]. Hours of Operation:  8:30 am - 5:30 pm, M-F.  Also accepts Medicaid and self-pay.  [facility] [place] [address], [place] Phone: [phone]   [facility] [address] Natasha Bence [place], [place] [phone], Ext. [phone] Mondays & Thursdays: 7-9 AM.  First 15 patients are seen on a first come, first serve basis.    Medicaid-accepting [facility] Providers:  Organization         Address  Phone   Notes  [facility] [address],  [phone] Also accepts self-pay patients.  [facility] [address] Laurell Josephs [place], [place]  [phone]   [facility] [address], [place]  [phone]   [facility] [address] [place] [phone]   Renaye Rakers [address], [place]   [phone] Only accepts [place] patients after they have their name applied to their card.   Self-Pay (no insurance) in [facility]:  Organization         Address  Phone   Notes  Sickle Cell Patients, [facility] [address] [place], [place] [phone]   [facility] [address] [place], [place] [phone]   [facility] [place]  [address],  [phone]   [facility]/Dr. Osei-Bonsu  [address], [place] or [address], [place] [phone] Phone number for both [place] and [place] locations is the same.  [facility] [address] [place] [phone]   [facility] [address] [place] or [address] [phone]   [facility] [address], [place] [phone], phone; [phone], fax Sees patients 1st and 3rd Saturday of every month.  Must not qualify for public or private insurance (i.e. Medicaid, Medicare, [place] Health Choice, Veterans' Benefits)  Household income should be no more than 200% of the poverty level The clinic cannot treat you if you are pregnant or think you are pregnant  Sexually transmitted diseases are not treated at the clinic.    Dental Care: Organization         Address  Phone  Notes  [facility] Department of [facility] [address] [place], [place] [phone] Accepts children up to age 23 who are enrolled in [place] or [place] Health Choice; pregnant women with a Medicaid card; and children who have applied for Medicaid or [place] Health Choice, but were declined, whose parents can pay a reduced fee at time of service.  [facility]  Department of [facility]  [address], [place] [phone] Accepts children up to age 35 who are enrolled in [place] or [place] Health Choice; pregnant women with a Medicaid card; and children who have applied for Medicaid or [place] Health Choice, but were declined, whose parents can pay a reduced fee at time of service.  [facility]  [address] [place], [place] [phone] Patients are seen by appointment only. Walk-ins are not accepted. [facility] will see patients 71 years of age and older. Monday - Tuesday (8am-5pm) Most Wednesdays (8:30-5pm) $30 per visit, cash only  [facility]  [address], [facility] [phone] Patients are seen by appointment only. Walk-ins are not accepted. [facility] will see patients 42 years of age and older. One Wednesday Evening (Monthly: Volunteer Based).  $30 per visit, cash only  [employer]  [phone] for adults; Children under age 35, call Graduate Pediatric Dentistry at [phone]. Children aged 63-14, please call [phone] to request a pediatric application.  Dental services are provided in all areas of dental care including fillings, crowns and bridges, complete and partial dentures, implants, gum treatment, root canals, and extractions. Preventive care is also provided. Treatment is provided to both adults and children. Patients are selected via a lottery and there is often a waiting list.   [facility] [address] [place]  [phone] [email or web address]   [facility] [address] [place], [place] [phone], Ext. [phone] Second and Fourth Thursday of each month, opens at 6:30 AM; Clinic ends at 9 AM.  Patients are seen on a first-come first-served basis, and a limited number are seen during each clinic.   [facility]  [address] Ether Griffins [place], [place] [phone]    Eligibility Requirements You must have lived in [place], [place], or [place] counties for at least the last three months.   You cannot be eligible for state or federal sponsored [place], including  CIG[employer]eterans Administration, [place]icaid, or [employer]icare.   You generally cannot be eligible for healthcare insurance through your employer.    How to apply: Eligibility screenings are held every Tuesday and Wednesday afternoon from 1:00 pm until 4:00 pm. You do not need an appointment for the interview!  [facility] [address], [place], [place] [phone]   [facility]  [phone]   [facility]  [phone]   [facility]  [phone]    Behavioral Health Resources in the Community: Intensive Outpatient Programs Organization         Address  Phone  Notes  [facility] [address], [place], [place] [phone]   [facility] [address], [place], [place] [phone]   [facility] [address], [place], [place]  [phone]   [facility] [address],  [place], [place] [phone] or [phone]   Substance Abuse Resources Organization         Address  Phone  Notes  [facility]  [phone]   [facility]  [phone]   [facility]  [phone]   [facility]  [phone]   Residential & Outpatient Substance Abuse Program  [phone]   Psychological Services Organization         Address  Phone  Notes  [facility]  [phone]   [facility]  [phone]- [phone]   [facility] [address], [place] [phone] or [phone]    Mobile Crisis Teams Organization         Address  Phone  Notes  [facility], Mobile Crisis Care Unit  [phone]   [facility]  [address]  [place], [place] [phone]   Doristine LocksSharon DeEsch [address] [place] [phone]    Self-Help/Support Groups Organization         Address  Phone             Notes  [facility] [place] - variety of support groups  [phone]- [phone] Call for more information  Narcotics Anonymous (NA), [facility] [address], [place]  2 meetings at this location   Statisticianesidential Treatment Programs Organization         Address  Phone  Notes  [facility] [address],    [place]  [phone]   [facility]  [address], [place] [phone], [place], [place] [phone]   [facility] [address], [place] [phone] Admissions: 8am-3pm M-F  [facility] [address]    [place], [place] [phone]   [facility] [address], [place], [place] [phone]   [facility] [address]  [place], [place] [phone]   [facility] - Intensive Outpatient [address], [place], [place] [phone]   [facility] ([facility]iction [facility] [address]  [place], [place] [phone] or [phone]   [facility] ([facility]) [address] [place], [place] [phone] Accepts Medicaid  [facility] [address]  [place] [phone] Substance Abuse/Addiction Treatment   [facility] Resources Organization         Address  Phone  Notes  [facility]  [phone]   Angie FavaJulie Brannon, PhD [address] [place], [place]   [phone] or [phone]) [phone]  [facility]   [address] [place], [place] [phone]   [facility] [address] [place], [place] [phone] Insurance/Medicaid/sponsorship through [facility] Families [address]                                    [place], [place] [phone] Therapy/tele-psych/case    [facility] [address]   [place], [place] [phone]    Dr. Lolly Mustache  [phone]   [facility]  [facility] Dept. 1) [address] [place] 2) [address], [place] 3)  [address], [place] [phone]  [phone]   [facility] Child Abuse Hotline 902-740-7293 or [phone] (After Hours)

## 2015-05-01 ENCOUNTER — Emergency Department (HOSPITAL_BASED_OUTPATIENT_CLINIC_OR_DEPARTMENT_OTHER)
Admission: EM | Admit: 2015-05-01 | Discharge: 2015-05-01 | Disposition: A | Discharge status: Home / Self Care | Payer: Medicaid Other | Attending: Emergency Medicine | Admitting: Emergency Medicine

## 2015-05-01 ENCOUNTER — Encounter (HOSPITAL_BASED_OUTPATIENT_CLINIC_OR_DEPARTMENT_OTHER): Payer: Self-pay

## 2015-05-01 DIAGNOSIS — D649 Anemia, unspecified: Secondary | ICD-10-CM | POA: Diagnosis not present

## 2015-05-01 DIAGNOSIS — R0982 Postnasal drip: Secondary | ICD-10-CM | POA: Insufficient documentation

## 2015-05-01 DIAGNOSIS — Z3202 Encounter for pregnancy test, result negative: Secondary | ICD-10-CM | POA: Diagnosis not present

## 2015-05-01 DIAGNOSIS — R110 Nausea: Secondary | ICD-10-CM | POA: Diagnosis not present

## 2015-05-01 DIAGNOSIS — Z87891 Personal history of nicotine dependence: Secondary | ICD-10-CM | POA: Insufficient documentation

## 2015-05-01 LAB — URINALYSIS, ROUTINE W REFLEX MICROSCOPIC
BILIRUBIN URINE: NEGATIVE
Glucose, UA: NEGATIVE mg/dL
KETONES UR: NEGATIVE mg/dL
Nitrite: NEGATIVE
PROTEIN: NEGATIVE mg/dL
Specific Gravity, Urine: 1.026 (ref 1.005–1.030)
UROBILINOGEN UA: 1 mg/dL (ref 0.0–1.0)
pH: 5.5 (ref 5.0–8.0)

## 2015-05-01 LAB — COMPREHENSIVE METABOLIC PANEL
ALBUMIN: 3.9 g/dL (ref 3.5–5.0)
ALK PHOS: 48 U/L (ref 38–126)
ALT: 15 U/L (ref 14–54)
AST: 21 U/L (ref 15–41)
Anion gap: 9 (ref 5–15)
BILIRUBIN TOTAL: 0.5 mg/dL (ref 0.3–1.2)
BUN: 8 mg/dL (ref 6–20)
CALCIUM: 9.4 mg/dL (ref 8.9–10.3)
CO2: 23 mmol/L (ref 22–32)
Chloride: 106 mmol/L (ref 101–111)
Creatinine, Ser: 0.76 mg/dL (ref 0.44–1.00)
GFR calc Af Amer: >60 mL/min (ref >60)
GFR calc non Af Amer: >60 mL/min (ref >60)
GLUCOSE: 104 mg/dL — AB (ref 65–99)
Potassium: 3.8 mmol/L (ref 3.5–5.1)
Sodium: 138 mmol/L (ref 135–145)
TOTAL PROTEIN: 7.5 g/dL (ref 6.5–8.1)

## 2015-05-01 LAB — CBC WITH DIFFERENTIAL/PLATELET
BASOS PCT: 1 % (ref 0–1)
Basophils Absolute: 0 10*3/uL (ref 0.0–0.1)
Eosinophils Absolute: 0.1 10*3/uL (ref 0.0–0.7)
Eosinophils Relative: 2 % (ref 0–5)
HEMATOCRIT: 33 % — AB (ref 36.0–46.0)
Hemoglobin: 10.6 g/dL — ABNORMAL LOW (ref 12.0–15.0)
Lymphocytes Relative: 52 % — ABNORMAL HIGH (ref 12–46)
Lymphs Abs: 2.1 10*3/uL (ref 0.7–4.0)
MCH: 25.8 pg — AB (ref 26.0–34.0)
MCHC: 32.1 g/dL (ref 30.0–36.0)
MCV: 80.3 fL (ref 78.0–100.0)
MONO ABS: 0.5 10*3/uL (ref 0.1–1.0)
MONOS PCT: 12 % (ref 3–12)
NEUTROS ABS: 1.3 10*3/uL — AB (ref 1.7–7.7)
Neutrophils Relative %: 33 % — ABNORMAL LOW (ref 43–77)
Platelets: 436 10*3/uL — ABNORMAL HIGH (ref 150–400)
RBC: 4.11 MIL/uL (ref 3.87–5.11)
RDW: 14.3 % (ref 11.5–15.5)
WBC: 4 10*3/uL (ref 4.0–10.5)

## 2015-05-01 LAB — URINE MICROSCOPIC-ADD ON

## 2015-05-01 LAB — OCCULT BLOOD X 1 CARD TO LAB, STOOL: Fecal Occult Bld: NEGATIVE

## 2015-05-01 LAB — LIPASE, BLOOD: Lipase: 29 U/L (ref 22–51)

## 2015-05-01 LAB — PREGNANCY, URINE: PREG TEST UR: NEGATIVE

## 2015-05-01 MED ORDER — SODIUM CHLORIDE 0.9 % IV BOLUS (SEPSIS)
1000.0000 mL | Freq: Once | INTRAVENOUS | Status: AC
  Administered 2015-05-01: 1000 mL via INTRAVENOUS

## 2015-05-01 MED ORDER — PROMETHAZINE HCL 25 MG PO TABS: 25.0000 mg | ORAL_TABLET | Freq: Four times a day (QID) | ORAL | Status: DC | PRN

## 2015-05-01 MED ORDER — FERROUS SULFATE 325 (65 FE) MG PO TABS: 325.0000 mg | ORAL_TABLET | Freq: Three times a day (TID) | ORAL | Status: DC

## 2015-05-01 MED ORDER — ONDANSETRON HCL 4 MG/2ML IJ SOLN
4.0000 mg | Freq: Once | INTRAMUSCULAR | Status: AC
  Administered 2015-05-01: 4 mg via INTRAVENOUS
  Filled 2015-05-01: qty 2

## 2015-05-01 NOTE — ED Notes (Signed)
Patient here with nausea that started on Friday, reports abdominal cramps, no vomiting, no dirrhea

## 2015-05-01 NOTE — ED Provider Notes (Signed)
CSN: [id number]     Arrival date & time 05/01/15  1243 History   First MD Initiated Contact with Patient 05/01/15 1311     Chief Complaint  Patient presents with  . Nausea     (Consider location/radiation/quality/duration/timing/severity/associated sxs/prior Treatment) HPI  Blood pressure 150/94, pulse 82, temperature 98.2 F (36.8 C), temperature source Oral, resp. rate 20, height  (1.6 m), weight 192 lb (87.091 kg), last menstrual period 04/30/2015, SpO2 100 %.  Sherry Clark is a 44 y.o. female complaining of nausea onset 2 days ago without vomiting. Patient also reports a lateral lower extremity cramping sensation onset yesterday. Patient started her period yesterday. She denies fever, chills, change in bowel or bladder habits. Review of systems patient notes a nasal congestion, she's been using Flonase intermittently, she hasn't been able to bring any mucus out of the nose.  History reviewed. No pertinent past medical history. Past Surgical History  Procedure Laterality Date  . Knee surgery      Left Knee  . Tubal ligation     No family history on file. Social History  Substance Use Topics  . Smoking status: Former Smoker -- 0.50 packs/day for 10 years    Types: Cigarettes  . Smokeless tobacco: Never Used  . Alcohol Use: No   OB History    No data available     Review of Systems  10 systems reviewed and found to be negative, except as noted in the HPI.  Allergies  Review of patient's allergies indicates no known allergies.  Home Medications   Prior to Admission medications   Medication Sig Start Date End Date Taking? Authorizing Provider  ferrous sulfate 325 (65 FE) MG tablet Take 1 tablet (325 mg total) by mouth 3 (three) times daily with meals. 05/01/15   Jasmain Ahlberg, PA-C  ibuprofen (ADVIL,MOTRIN) 600 MG tablet Take 1 tablet (600 mg total) by mouth every 6 (six) hours as needed. 10/06/14   Arby Barrette, MD  promethazine (PHENERGAN) 25 MG tablet  Take 1 tablet (25 mg total) by mouth every 6 (six) hours as needed for nausea or vomiting. 05/01/15   Joni Reining Kimbley Sprague, PA-C   BP 120/61 mmHg  Pulse 58  Temp(Src) 98.2 F (36.8 C) (Oral)  Resp 18  Ht  (1.6 m)  Wt 192 lb (87.091 kg)  BMI 34.02 kg/m2  SpO2 100%  LMP 04/30/2015 Physical Exam  Constitutional: She is oriented to person, place, and time. She appears well-developed and well-nourished. No distress.  HENT:  Head: Normocephalic.  No drooling or stridor. Posterior pharynx mildly erythematous no significant tonsillar hypertrophy. No exudate. Soft palate rises symmetrically. No TTP or induration under tongue.   No tenderness to palpation of frontal or bilateral maxillary sinuses.  No mucosal edema in the nares.  Bilateral tympanic membranes with normal architecture and good light reflex.    Eyes: Conjunctivae and EOM are normal. Pupils are equal, round, and reactive to light.  Neck: Normal range of motion.  Cardiovascular: Normal rate and regular rhythm.   Pulmonary/Chest: Effort normal and breath sounds normal. No stridor. No respiratory distress. She has no wheezes. She has no rales. She exhibits no tenderness.  Abdominal: Soft. Bowel sounds are normal. She exhibits no distension and no mass. There is no tenderness. There is no rebound and no guarding.  Musculoskeletal: Normal range of motion.  Neurological: She is alert and oriented to person, place, and time.  Psychiatric: She has a normal mood and affect.  Nursing note  and vitals reviewed.   ED Course  Procedures (including critical care time) Labs Review Labs Reviewed  URINALYSIS, ROUTINE W REFLEX MICROSCOPIC (NOT AT [facility]) - Abnormal; Notable for the following:    APPearance TURBID (*)    Hgb urine dipstick TRACE (*)    Leukocytes, UA MODERATE (*)    All other components within normal limits  URINE MICROSCOPIC-ADD ON - Abnormal; Notable for the following:    Squamous Epithelial / LPF FEW (*)    Bacteria,  UA FEW (*)    All other components within normal limits  COMPREHENSIVE METABOLIC PANEL - Abnormal; Notable for the following:    Glucose, Bld 104 (*)    All other components within normal limits  CBC WITH DIFFERENTIAL/PLATELET - Abnormal; Notable for the following:    Hemoglobin 10.6 (*)    HCT 33.0 (*)    MCH 25.8 (*)    Platelets 436 (*)    Neutrophils Relative % 33 (*)    Neutro Abs 1.3 (*)    Lymphocytes Relative 52 (*)    All other components within normal limits  PREGNANCY, URINE  LIPASE, BLOOD  OCCULT BLOOD X 1 CARD TO LAB, STOOL  POC OCCULT BLOOD, ED    Imaging Review No results found. I have personally reviewed and evaluated these images and lab results as part of my medical decision-making.   EKG Interpretation None      MDM   Final diagnoses:  Nausea  Post-nasal drip  Anemia, unspecified anemia type    Filed Vitals:   05/01/15 1251 05/01/15 1521  BP: 150/94 120/61  Pulse: 82 58  Temp: 98.2 F (36.8 C)   TempSrc: Oral   Resp: 20 18  Height: 5\' 3"  (1.6 m)   Weight: 192 lb (87.091 kg)   SpO2: 100% 100%    Medications  sodium chloride 0.9 % bolus 1,000 mL (1,000 mLs Intravenous New Bag/Given 05/01/15 1351)  ondansetron (ZOFRAN) injection 4 mg (4 mg Intravenous Given 05/01/15 1351)    Sherry Clark is a pleasant 44 y.o. female presenting with nausea onset several days ago, I think this is likely secondary to a postnasal drip. Her abdominal exam is benign. She reports a bilateral lower abdominal cramping yesterday, she did start her menses yesterday. Will check basic blood work given a little bolus and Zofran. I've encouraged her to use Flonase regularly.  Patient's blood work shows anemia, does not significant her hemoglobin is 10. No indication for transfusion. Patient does state that she has very heavy menses, she states she also had a very dark bowel movement this morning. Fecal occult blood is negative however I will start her on iron  supplementation ask her to follow very closely with her primary care doctor.  Evaluation does not show pathology that would require ongoing emergent intervention or inpatient treatment. Pt is hemodynamically stable and mentating appropriately. Discussed findings and plan with patient/guardian, who agrees with care plan. All questions answered. Return precautions discussed and outpatient follow up given.   New Prescriptions   FERROUS SULFATE 325 (65 FE) MG TABLET    Take 1 tablet (325 mg total) by mouth 3 (three) times daily with meals.   PROMETHAZINE (PHENERGAN) 25 MG TABLET    Take 1 tablet (25 mg total) by mouth every 6 (six) hours as needed for nausea or vomiting.         Wynetta Emery, PA-C 05/01/15 1533  Jerelyn Scott, MD 05/01/15 1538

## 2015-05-01 NOTE — Discharge Instructions (Signed)
Use nasal saline (you can try Arm and Hammer Simply Saline) at least 4 times a day, use saline 5-10 minutes before using the fluticasone (flonase) nasal spray  Do not use Afrin (Oxymetazoline)  Rest, wash hands frequently  and drink plenty of water.  You may try counter medication such as Mucinex or Sudafed decongestant.  Do not hesitate to return to the emergency room for any new, worsening or concerning symptoms.  Please obtain primary care using resource guide below. Let them know that you were seen in the emergency room and that they will need to obtain records for further outpatient management.   Nausea, Adult Nausea is the feeling that you have an upset stomach or have to vomit. Nausea by itself is not likely a serious concern, but it may be an early sign of more serious medical problems. As nausea gets worse, it can lead to vomiting. If vomiting develops, there is the risk of dehydration.  CAUSES   Viral infections.  Food poisoning.  Medicines.  Pregnancy.  Motion sickness.  Migraine headaches.  Emotional distress.  Severe pain from any source.  Alcohol intoxication. HOME CARE INSTRUCTIONS  Get plenty of rest.  Ask your caregiver about specific rehydration instructions.  Eat small amounts of food and sip liquids more often.  Take all medicines as told by your caregiver. SEEK MEDICAL CARE IF:  You have not improved after 2 days, or you get worse.  You have a headache. SEEK IMMEDIATE MEDICAL CARE IF:   You have a fever.  You faint.  You keep vomiting or have blood in your vomit.  You are extremely weak or dehydrated.  You have dark or bloody stools.  You have severe chest or abdominal pain. MAKE SURE YOU:  Understand these instructions.  Will watch your condition.  Will get help right away if you are not doing well or get worse. Document Released: 10/04/2004 Document Revised: 05/21/2012 Document Reviewed: 05/09/2011 [facility] Patient  Information 2015 [place], [place]. This information is not intended to replace advice given to you by your health care provider. Make sure you discuss any questions you have with your health care provider.   Emergency Department Resource Guide 1) Find a Doctor and Pay Out of Pocket Although you won't have to find out who is covered by your insurance plan, it is a good idea to ask around and get recommendations. You will then need to call the office and see if the doctor you have chosen will accept you as a new patient and what types of options they offer for patients who are self-pay. Some doctors offer discounts or will set up payment plans for their patients who do not have insurance, but you will need to ask so you aren't surprised when you get to your appointment.  2) Contact Your Local Health Department Not all health departments have doctors that can see patients for sick visits, but many do, so it is worth a call to see if yours does. If you don't know where your local health department is, you can check in your phone book. The CDC also has a tool to help you locate your state's health department, and many state websites also have listings of all of their local health departments.  3) Find a Walk-in Clinic If your illness is not likely to be very severe or complicated, you may want to try a walk in clinic. These are popping up all over the country in pharmacies, drugstores, and shopping centers. They're usually  staffed by nurse practitioners or physician assistants that have been trained to treat common illnesses and complaints. They're usually fairly quick and inexpensive. However, if you have serious medical issues or chronic medical problems, these are probably not your best option.  No Primary Care Doctor: - Call Health Connect at  [phone] - they can help you locate a primary care doctor that  accepts your insurance, provides certain services, etc. - Physician Referral Service-  [phone]  Chronic Pain Problems: Organization         Address  Phone   Notes  [facility]  [phone]) [phone] Patients need to be referred by their primary care doctor.   Medication Assistance: Organization         Address  Phone   Notes  [facility] Medication [facility] [address] [place], [place] [address] [phone]) [phone] --Must be a resident of [place] [facility] -- Must have NO insurance coverage whatsoever (no Medicaid/ Medicare, etc.) -- The pt. MUST have a primary care doctor that directs their care regularly and follows them in the community   MedAssist  [phone]   [employer]  [phone]    Agencies that provide inexpensive medical care: Organization         Address  Phone   Notes  [facility]  [phone]   [facility]    [phone]   [facility] [address] [place], [place] [address] [phone]   [facility] [place] [address], [place] [phone]) [phone]   [facility]anned [facility]    [phone](336) [phone]   [facility]    [phone]   [facility] and [facility]  [address], [place] Phone:  [phone], Fax:  [phone] Hours of Operation:  9 am - 6 pm, M-F.  Also accepts Medicaid/Medicare and self-pay.  [facility]  [address], [place] Phone: [phone], Fax: [phone]) [phone]. Hours of Operation:  8:30 am - 5:30 pm, M-F.  Also accepts Medicaid and self-pay.  [facility] [place] [address], [place] Phone: [phone](336) [phone]   [facility] [address], [place], [place] [phone], Ext. [phone] Mondays & Thursdays: 7-9 AM.  First 15 patients are seen on a first come, first serve basis.    Medicaid-accepting [facility] Providers:  Organization         Address  Phone   Notes  [facility] [address],  [place] [phone]) [phone] Also accepts self-pay patients.  [facility] [address], [place]  [phone]   [facility] [address], [place] [phone]   [facility] [address] [place] [address], [place] [phone]) [phone]   Renaye RakersVeita Bland [address], [place]   [phone] Only accepts [place] Access [place] patients after they have their name applied to their card.   Self-Pay (no insurance) in [facility] [place]:  Organization         Address  Phone   Notes  Sickle Cell Patients, [facility] [address], [place] [phone]) [phone]   [facility] [address], [place] [phone]) [phone]   [facility] [place]  [address], [place] [phone]   [facility]/Dr. Osei-Bonsu  [address], [place] or [address], [place] [phone] Phone number for both [place] and [place] locations is the same.  [facility] [address] [place] [phone]   [facility] [address], [place] or [address] [phone]   [facility] [address] [place] [phone], phone; [phone], fax Sees patients 1st and 3rd Saturday of every month.  Must not qualify for public or private insurance (i.e. Medicaid, Medicare, [place] Health Choice, Veterans' Benefits)  Household income should be no more than 200% of the poverty level The clinic cannot treat you if you are pregnant or think you are pregnant  Sexually transmitted diseases are not treated at the clinic.    Dental Care: Organization         Address  Phone  Notes  [facility] of [facility] [address] [place], [place] [phone] Accepts children up to age 54 who are enrolled in  [place] or [place] Health Choice; pregnant women with a Medicaid card; and children who have applied for Medicaid or [place] Health Choice, but were declined, whose parents can pay a reduced fee at time of service.  [facility]  [address], [place] [phone] Accepts children up to age 41 who are enrolled in [place] or [place] Health Choice; pregnant women with a Medicaid card; and children who have applied for Medicaid or [place] Health Choice, but were declined, whose parents can pay a reduced fee at time of service.  [facility]  [address] [place], [place] [phone] Patients are seen by appointment only. Walk-ins are not accepted. [facility] will see patients 32 years of age and older. Monday - Tuesday (8am-5pm) Most Wednesdays (8:30-5pm) $30 per visit, cash only  [facility]  [address], [facility] [place] [phone] Patients are seen by appointment only. Walk-ins are not accepted. [facility] will see patients 42 years of age and older. One Wednesday Evening (Monthly: Volunteer Based).  $30 per visit, cash only  [employer]  [phone] for adults; Children under age 73, call Graduate Pediatric Dentistry at [phone]. Children aged 84-14, please call [phone] to request a pediatric application.  Dental services are provided in all areas of dental care including fillings, crowns and bridges, complete and partial dentures, implants, gum treatment, root canals, and extractions. Preventive care is also provided. Treatment is provided to both adults and children. Patients are selected via a lottery and there is often a waiting list.   [facility] [address], [place]  [phone] [email or web address]   [facility] [address] [place], [place] [phone], Ext. [phone] Second and Fourth Thursday of each month, opens at 6:30  AM; Clinic ends at 9 AM.  Patients are seen on a first-come first-served basis, and a limited number are seen during each clinic.   [facility]  [address] [place], [place] [phone]   Eligibility Requirements You must have lived in [place], [place], or [place] counties for at least the last three months.   You cannot be eligible for state or federal sponsored [place], including [employer], [place], or [employer].   You generally cannot be eligible for healthcare insurance through your  employer.    How to apply: Eligibility screenings are held every Tuesday and Wednesday afternoon from 1:00 pm until 4:00 pm. You do not need an appointment for the interview!  [facility] [address], [place], [place] [phone]   [facility]  [phone]   [facility]  [phone]   [facility]  [phone]    Behavioral Health Resources in the Community: Intensive Outpatient Programs Organization         Address  Phone  Notes  [facility] [address], [place], [place] [phone]   [facility] [address], [place], [place] [phone]   [facility] [address], [place], [place]  [phone]   [facility] [address],  [place], [place] [phone] or [phone]   Substance Abuse Resources Organization         Address  Phone  Notes  [facility]  [phone]   [facility]  [phone]   [facility]  [phone]   [facility]  [phone]   Residential & Outpatient Substance Abuse Program  [phone]   Psychological Services Organization         Address  Phone  Notes  [facility]  [phone]- [phone]   [facility]  [phone]   [facility] [address], [place] [phone] or  [phone]    Mobile Crisis Teams Organization         Address  Phone  Notes  [facility], Mobile Crisis Care Unit  [phone]   [facility]  [address] [place], [place] [phone]   Doristine LocksSharon DeEsch [address] [place] [phone]    Self-Help/Support Groups Organization         Address  Phone             Notes  [facility] [place] - variety of support groups  [phone]- [phone] Call for more information  Narcotics Anonymous (NA), [facility]aring [facility] [address], [place]  2 meetings at this location   Statisticianesidential Treatment Programs Organization         Address  Phone  Notes  [facility] [address],    [place]  [phone]   [facility]  [address], [place] [phone], [place], [place] [phone]   [facility] [address], [place] [phone] Admissions: 8am-3pm M-F  [facility] [address]    [place], [place] [phone]   [facility] [address], [place], [place] [phone]   [facility] [address]  [place], [place] [phone]   [facility] - Intensive Outpatient [address], [place], [place] [phone]   [facility] ([facility] [address]  [place], [place] [phone] or [phone]   [facility] ([facility]) [address] [place], [place] [phone] Accepts Medicaid  [facility] [address]  [place] [phone] Substance Abuse/Addiction Treatment   [facility] Resources Organization         Address  Phone  Notes  [facility]  [phone]) [phone]   Angie FavaJulie Brannon, PhD [address] [place], [place]   [phone]) [phone] or [phone](336) [phone]   [facility]   [address] [place], [place] [phone](336) [phone]   [facility] [address],  [place], [place] [phone] Insurance/Medicaid/sponsorship through [employer] and Families [address]                                    [place], [place] [phone] Therapy/tele-psych/case  [facility] [address]   [place], [place] [phone]    Dr. Lolly Mustache  [phone]   [facility] [place]  [employer] The Paviliion Dept. 1) [address] [place] 2) [address] [place] 3)  [address], [place] [phone]  [phone]   [facility] Child Abuse Hotline [phone] or [phone] (After Hours)

## 2015-05-19 ENCOUNTER — Emergency Department (HOSPITAL_BASED_OUTPATIENT_CLINIC_OR_DEPARTMENT_OTHER)
Admission: EM | Admit: 2015-05-19 | Discharge: 2015-05-19 | Disposition: A | Discharge status: Home / Self Care | Payer: Medicaid Other | Attending: Emergency Medicine | Admitting: Emergency Medicine

## 2015-05-19 ENCOUNTER — Emergency Department (HOSPITAL_BASED_OUTPATIENT_CLINIC_OR_DEPARTMENT_OTHER): Payer: Medicaid Other

## 2015-05-19 ENCOUNTER — Encounter (HOSPITAL_BASED_OUTPATIENT_CLINIC_OR_DEPARTMENT_OTHER): Payer: Self-pay | Admitting: *Deleted

## 2015-05-19 DIAGNOSIS — Z87891 Personal history of nicotine dependence: Secondary | ICD-10-CM | POA: Diagnosis not present

## 2015-05-19 DIAGNOSIS — R079 Chest pain, unspecified: Secondary | ICD-10-CM | POA: Insufficient documentation

## 2015-05-19 DIAGNOSIS — R05 Cough: Secondary | ICD-10-CM | POA: Diagnosis not present

## 2015-05-19 DIAGNOSIS — Z79899 Other long term (current) drug therapy: Secondary | ICD-10-CM | POA: Insufficient documentation

## 2015-05-19 LAB — CBC
HCT: 31.3 % — ABNORMAL LOW (ref 36.0–46.0)
Hemoglobin: 10.1 g/dL — ABNORMAL LOW (ref 12.0–15.0)
MCH: 26.2 pg (ref 26.0–34.0)
MCHC: 32.3 g/dL (ref 30.0–36.0)
MCV: 81.3 fL (ref 78.0–100.0)
PLATELETS: 428 10*3/uL — AB (ref 150–400)
RBC: 3.85 MIL/uL — ABNORMAL LOW (ref 3.87–5.11)
RDW: 15 % (ref 11.5–15.5)
WBC: 5.6 10*3/uL (ref 4.0–10.5)

## 2015-05-19 LAB — COMPREHENSIVE METABOLIC PANEL
ALT: 15 U/L (ref 14–54)
ANION GAP: 7 (ref 5–15)
AST: 17 U/L (ref 15–41)
Albumin: 3.5 g/dL (ref 3.5–5.0)
Alkaline Phosphatase: 45 U/L (ref 38–126)
BILIRUBIN TOTAL: 0.3 mg/dL (ref 0.3–1.2)
BUN: 8 mg/dL (ref 6–20)
CO2: 22 mmol/L (ref 22–32)
Calcium: 9.2 mg/dL (ref 8.9–10.3)
Chloride: 109 mmol/L (ref 101–111)
Creatinine, Ser: 0.8 mg/dL (ref 0.44–1.00)
GFR calc Af Amer: >60 mL/min (ref >60)
Glucose, Bld: 91 mg/dL (ref 65–99)
POTASSIUM: 3.8 mmol/L (ref 3.5–5.1)
Sodium: 138 mmol/L (ref 135–145)
TOTAL PROTEIN: 7 g/dL (ref 6.5–8.1)

## 2015-05-19 LAB — TROPONIN I: Troponin I: <0.03 ng/mL (ref <0.031)

## 2015-05-19 MED ORDER — ASPIRIN 81 MG PO CHEW
324.0000 mg | CHEWABLE_TABLET | Freq: Once | ORAL | Status: AC
  Administered 2015-05-19: 324 mg via ORAL
  Filled 2015-05-19: qty 4

## 2015-05-19 NOTE — ED Notes (Signed)
Heaviness in her left chest radiating into her jaw for an hour.

## 2015-05-19 NOTE — ED Provider Notes (Signed)
CSN: [id number]     Arrival date & time 05/19/15  2013 History  This chart was scribed for Mirian Mo, MD by Doreatha Truss, ED Scribe. This patient was seen in room MH03/MH03 and the patient's care was started at 8:40 PM.     Chief Complaint  Patient presents with  . Chest Pain   Patient is a 44 y.o. female presenting with chest pain. The history is provided by the patient. No language interpreter was used.  Chest Pain Pain location:  Substernal area Pain quality: sharp   Pain radiates to:  L jaw Pain radiates to the back: no   Pain severity:  Moderate Duration:  1 hour Context: breathing   Worsened by:  Deep breathing Ineffective treatments: ibuprofen. Associated symptoms: cough   Associated symptoms: no fever, no nausea and not vomiting   Risk factors: smoking   Risk factors: no surgery     HPI Comments: Sherry Clark is a 44 y.o. female with no significant medical Hx who presents to the Emergency Department complaining of moderate, sharp 9/10 CP that radiates to the left jaw onset an hour ago with associated right arm calor, dry cough onset 3 days ago. Pt reports that pain is worsened with deep breathing. Pt is an occasional smoker. No sick contact. No FHx of CAD/CHF<55yo. Pt took an Ibuprofen with mild relief, but no ASA. No Hx of PE/DVT, recent travel, surgery, prolonged immobilization. She denies dyspnea, nausea, vomiting, fever, leg swelling.   History reviewed. No pertinent past medical history. Past Surgical History  Procedure Laterality Date  . Knee surgery      Left Knee  . Tubal ligation     No family history on file. Social History  Substance Use Topics  . Smoking status: Former Smoker -- 0.50 packs/day for 10 years    Types: Cigarettes  . Smokeless tobacco: Never Used  . Alcohol Use: No   OB History    No data available     Review of Systems  Constitutional: Negative for fever.  Respiratory: Positive for cough.   Cardiovascular: Positive for chest  pain. Negative for leg swelling.  Gastrointestinal: Negative for nausea and vomiting.  All other systems reviewed and are negative.  Allergies  Review of patient's allergies indicates no known allergies.  Home Medications   Prior to Admission medications   Medication Sig Start Date End Date Taking? Authorizing Provider  ferrous sulfate 325 (65 FE) MG tablet Take 1 tablet (325 mg total) by mouth 3 (three) times daily with meals. 05/01/15   Nicole Pisciotta, PA-C  ibuprofen (ADVIL,MOTRIN) 600 MG tablet Take 1 tablet (600 mg total) by mouth every 6 (six) hours as needed. 10/06/14   Arby Barrette, MD  promethazine (PHENERGAN) 25 MG tablet Take 1 tablet (25 mg total) by mouth every 6 (six) hours as needed for nausea or vomiting. 05/01/15   Joni Reining Pisciotta, PA-C   BP 121/79 mmHg  Pulse 81  Temp(Src) 98.6 F (37 C) (Oral)  Resp 18  Ht 5\' 3"  (1.6 m)  Wt 192 lb (87.091 kg)  BMI 34.02 kg/m2  SpO2 99%  LMP 04/30/2015 Physical Exam  Constitutional: She is oriented to person, place, and time. She appears well-developed and well-nourished.  HENT:  Head: Normocephalic and atraumatic.  Right Ear: External ear normal.  Left Ear: External ear normal.  Eyes: Conjunctivae and EOM are normal. Pupils are equal, round, and reactive to light.  Neck: Normal range of motion. Neck supple.  Cardiovascular: Normal rate,  regular rhythm, normal heart sounds and intact distal pulses.   Pulmonary/Chest: Effort normal and breath sounds normal.  Abdominal: Soft. Bowel sounds are normal. There is no tenderness.  Musculoskeletal: Normal range of motion.  Neurological: She is alert and oriented to person, place, and time.  Skin: Skin is warm and dry.  Vitals reviewed.   ED Course  Procedures (including critical care time) DIAGNOSTIC STUDIES: Oxygen Saturation is 98% on RA, normal by my interpretation.    COORDINATION OF CARE: 8:43 PM Discussed treatment plan with pt at bedside and pt agreed to plan.    Labs Review Labs Reviewed  CBC - Abnormal; Notable for the following:    RBC 3.85 (*)    Hemoglobin 10.1 (*)    HCT 31.3 (*)    Platelets 428 (*)    All other components within normal limits  TROPONIN I  COMPREHENSIVE METABOLIC PANEL  TROPONIN I    Imaging Review Dg Chest 2 View  05/19/2015   CLINICAL DATA:  44 year old female with chest pain  EXAM: CHEST  2 VIEW  COMPARISON:  Radiograph dated 09/26/2014  FINDINGS: The heart size and mediastinal contours are within normal limits. Both lungs are clear. The visualized skeletal structures are unremarkable.  IMPRESSION: No active cardiopulmonary disease.   Electronically Signed   By: Elgie Collard M.D.   On: 05/19/2015 21:56   I have personally reviewed and evaluated these images and lab results as part of my medical decision-making.   EKG Interpretation   Date/Time:  Thursday May 19 2015 20:23:20 EDT Ventricular Rate:  80 PR Interval:  146 QRS Duration: 76 QT Interval:  370 QTC Calculation: 426 R Axis:   50 Text Interpretation:  Normal sinus rhythm Cannot rule out Anterior infarct  , age undetermined Abnormal ECG No significant change since last tracing  Confirmed by Mirian Mo [phone]) on 05/19/2015 11:25:55 PM      MDM   Final diagnoses:  Chest pain, unspecified chest pain type    44 y.o. female without pertinent PMH presents with chest pain as above. Patient atypical for ACS, no dyspnea or other risk factors for pulmonary embolism. Exam as above.  Workup remarkable for mild anemia.  No blood loss reported.  PERC negative.  DC home in stable condition with relief of symptoms.  I have reviewed all laboratory and imaging studies if ordered as above  1. Chest pain, unspecified chest pain type          Mirian Mo, MD 05/20/15 [phone]

## 2015-05-19 NOTE — Discharge Instructions (Signed)

## 2015-05-19 NOTE — ED Notes (Addendum)
Pt alert, NAD, calm, interactive, resps e/u, speaking in clear complete sentences, no dyspnea noted, c/o CP, also radiation to L arm, jaw and shoulder. (denies: sob, cough, nvd, fever, dizziness or other sx), pain onset at 1900, last ate 1700, last BM today (normal), smoker. [facility] at [facility].

## 2015-08-14 ENCOUNTER — Encounter (HOSPITAL_BASED_OUTPATIENT_CLINIC_OR_DEPARTMENT_OTHER): Payer: Self-pay | Admitting: Emergency Medicine

## 2015-08-14 ENCOUNTER — Emergency Department (HOSPITAL_BASED_OUTPATIENT_CLINIC_OR_DEPARTMENT_OTHER)
Admission: EM | Admit: 2015-08-14 | Discharge: 2015-08-14 | Disposition: A | Discharge status: Home / Self Care | Payer: Medicaid Other | Attending: Emergency Medicine | Admitting: Emergency Medicine

## 2015-08-14 ENCOUNTER — Emergency Department (HOSPITAL_BASED_OUTPATIENT_CLINIC_OR_DEPARTMENT_OTHER): Payer: Medicaid Other

## 2015-08-14 DIAGNOSIS — K219 Gastro-esophageal reflux disease without esophagitis: Secondary | ICD-10-CM | POA: Diagnosis not present

## 2015-08-14 DIAGNOSIS — F17210 Nicotine dependence, cigarettes, uncomplicated: Secondary | ICD-10-CM | POA: Insufficient documentation

## 2015-08-14 DIAGNOSIS — R079 Chest pain, unspecified: Secondary | ICD-10-CM | POA: Diagnosis present

## 2015-08-14 DIAGNOSIS — Z79899 Other long term (current) drug therapy: Secondary | ICD-10-CM | POA: Diagnosis not present

## 2015-08-14 DIAGNOSIS — Z9851 Tubal ligation status: Secondary | ICD-10-CM | POA: Insufficient documentation

## 2015-08-14 LAB — BASIC METABOLIC PANEL
Anion gap: 7 (ref 5–15)
BUN: 11 mg/dL (ref 6–20)
CHLORIDE: 106 mmol/L (ref 101–111)
CO2: 25 mmol/L (ref 22–32)
CREATININE: 0.73 mg/dL (ref 0.44–1.00)
Calcium: 9 mg/dL (ref 8.9–10.3)
GFR calc Af Amer: >60 mL/min (ref >60)
Glucose, Bld: 98 mg/dL (ref 65–99)
POTASSIUM: 3.5 mmol/L (ref 3.5–5.1)
Sodium: 138 mmol/L (ref 135–145)

## 2015-08-14 LAB — CBC
HEMATOCRIT: 32.7 % — AB (ref 36.0–46.0)
Hemoglobin: 10.5 g/dL — ABNORMAL LOW (ref 12.0–15.0)
MCH: 26 pg (ref 26.0–34.0)
MCHC: 32.1 g/dL (ref 30.0–36.0)
MCV: 80.9 fL (ref 78.0–100.0)
PLATELETS: 440 10*3/uL — AB (ref 150–400)
RBC: 4.04 MIL/uL (ref 3.87–5.11)
RDW: 15.7 % — AB (ref 11.5–15.5)
WBC: 6.3 10*3/uL (ref 4.0–10.5)

## 2015-08-14 LAB — TROPONIN I: Troponin I: <0.03 ng/mL (ref <0.031)

## 2015-08-14 MED ORDER — SUCRALFATE 1 G PO TABS: 1.0000 g | ORAL_TABLET | Freq: Three times a day (TID) | ORAL | Status: DC

## 2015-08-14 MED ORDER — PANTOPRAZOLE SODIUM 20 MG PO TBEC: 20.0000 mg | DELAYED_RELEASE_TABLET | Freq: Every day | ORAL | Status: DC

## 2015-08-14 MED ORDER — PANTOPRAZOLE SODIUM 40 MG IV SOLR
40.0000 mg | Freq: Once | INTRAVENOUS | Status: AC
  Administered 2015-08-14: 40 mg via INTRAVENOUS
  Filled 2015-08-14: qty 40

## 2015-08-14 MED ORDER — GI COCKTAIL ~~LOC~~
30.0000 mL | Freq: Once | ORAL | Status: AC
  Administered 2015-08-14: 30 mL via ORAL
  Filled 2015-08-14: qty 30

## 2015-08-14 NOTE — ED Provider Notes (Signed)
CSN: [id number]     Arrival date & time 08/14/15  2015 History   First MD Initiated Contact with Patient 08/14/15 2116     Chief Complaint  Patient presents with  . Chest Pain     (Consider location/radiation/quality/duration/timing/severity/associated sxs/prior Treatment) Patient is a 44 y.o. female presenting with chest pain. The history is provided by the patient. No language interpreter was used.  Chest Pain Pain location:  Substernal area Pain quality: burning and sharp   Pain radiates to the back: yes   Pain severity:  Moderate Timing:  Constant Progression:  Waxing and waning Associated symptoms: no fever, no shortness of breath and not vomiting   Associated symptoms comment:  Pain that woke her from sleep described as burning, sharp, mid-sternal that radiates through to the back. No vomiting, new cough. She tried taking Aleve without relief. She currently takes Prilosec three times daily with meals and reports this isn't working. No SOB or previous history of heart disease. No fever.    History reviewed. No pertinent past medical history. Past Surgical History  Procedure Laterality Date  . Knee surgery      Left Knee  . Tubal ligation     History reviewed. No pertinent family history. Social History  Substance Use Topics  . Smoking status: Former Smoker -- 0.50 packs/day for 10 years    Types: Cigarettes  . Smokeless tobacco: Never Used  . Alcohol Use: No   OB History    No data available     Review of Systems  Constitutional: Negative for fever and chills.  HENT: Negative.   Respiratory: Negative.  Negative for shortness of breath.   Cardiovascular: Positive for chest pain.  Gastrointestinal: Negative.  Negative for vomiting.  Musculoskeletal: Negative.   Neurological: Negative.       Allergies  Review of patient's allergies indicates no known allergies.  Home Medications   Prior to Admission medications   Medication Sig Start Date End Date Taking?  Authorizing Provider  ferrous sulfate 325 (65 FE) MG tablet Take 1 tablet (325 mg total) by mouth 3 (three) times daily with meals. 05/01/15   Nicole Pisciotta, PA-C  ibuprofen (ADVIL,MOTRIN) 600 MG tablet Take 1 tablet (600 mg total) by mouth every 6 (six) hours as needed. 10/06/14   Arby BarretteMarcy Pfeiffer, MD  promethazine (PHENERGAN) 25 MG tablet Take 1 tablet (25 mg total) by mouth every 6 (six) hours as needed for nausea or vomiting. 05/01/15   Joni ReiningNicole Pisciotta, PA-C   BP 139/91 mmHg  Pulse 77  Temp(Src) 97.8 F (36.6 C) (Oral)  Resp 18  Ht 5\' 3"  (1.6 m)  Wt 86.183 kg  BMI 33.67 kg/m2  SpO2 100%  LMP  Physical Exam  Constitutional: She is oriented to person, place, and time. She appears well-developed and well-nourished.  HENT:  Head: Normocephalic.  Neck: Normal range of motion. Neck supple.  Cardiovascular: Normal rate and regular rhythm.   Pulmonary/Chest: Effort normal and breath sounds normal. She has no wheezes. She has no rales. She exhibits tenderness.  Abdominal: Soft. Bowel sounds are normal. There is no tenderness. There is no rebound and no guarding.  Musculoskeletal: Normal range of motion.  Neurological: She is alert and oriented to person, place, and time.  Skin: Skin is warm and dry. No rash noted.  Psychiatric: She has a normal mood and affect.    ED Course  Procedures (including critical care time) Labs Review Labs Reviewed  CBC - Abnormal; Notable for the following:  Hemoglobin 10.5 (*)    HCT 32.7 (*)    RDW 15.7 (*)    Platelets 440 (*)    All other components within normal limits  BASIC METABOLIC PANEL  TROPONIN I   Results for orders placed or performed during the hospital encounter of 08/14/15  Basic metabolic panel  Result Value Ref Range   Sodium 138 135 - 145 mmol/L   Potassium 3.5 3.5 - 5.1 mmol/L   Chloride 106 101 - 111 mmol/L   CO2 25 22 - 32 mmol/L   Glucose, Bld 98 65 - 99 mg/dL   BUN 11 6 - 20 mg/dL   Creatinine, Ser 1.61 0.44 - 1.00  mg/dL   Calcium 9.0 8.9 - 09.6 mg/dL   GFR calc non Af Amer >60 >60 mL/min   GFR calc Af Amer >60 >60 mL/min   Anion gap 7 5 - 15  CBC  Result Value Ref Range   WBC 6.3 4.0 - 10.5 K/uL   RBC 4.04 3.87 - 5.11 MIL/uL   Hemoglobin 10.5 (L) 12.0 - 15.0 g/dL   HCT 04.5 (L) 40.9 - 81.1 %   MCV 80.9 78.0 - 100.0 fL   MCH 26.0 26.0 - 34.0 pg   MCHC 32.1 30.0 - 36.0 g/dL   RDW 91.4 (H) 78.2 - 95.6 %   Platelets 440 (H) 150 - 400 K/uL  Troponin I  Result Value Ref Range   Troponin I <0.03 <0.031 ng/mL   Dg Chest 2 View  08/14/2015  CLINICAL DATA:  Burning chest pain radiating to the back. Shortness of breath. EXAM: CHEST  2 VIEW COMPARISON:  05/19/2015. FINDINGS: The heart size and mediastinal contours are within normal limits. Both lungs are clear. The visualized skeletal structures are unremarkable. IMPRESSION: Normal examination. Electronically Signed   By: Beckie Salts M.D.   On: 08/14/2015 21:47    Imaging Review No results found. I have personally reviewed and evaluated these images and lab results as part of my medical decision-making.   EKG Interpretation None      MDM   Final diagnoses:  None    1. GERD  Patient is feeling some better after GI cocktail. Symptoms are atypical for cardiac chest pain and her work up is negative here. She is some better with GI cocktail and IV Protonix and has ahistory of GERD which are similar to present symptoms. Feels she can be discharged home with change of medication from prilosec to Protonix and carafate. Follow up with her PCP for recheck in 2-3 days.    Elpidio Anis, PA-C 08/14/15 2246  Loren Racer, MD 08/15/15 [phone]

## 2015-08-14 NOTE — ED Notes (Signed)
Patient states that she ate about 1 pm - then she laid down at 5  - she states that she woke up to having a burning pain to her chest and into her back. The patient reports that she is also SOB

## 2015-08-14 NOTE — Discharge Instructions (Signed)

## 2015-09-28 ENCOUNTER — Emergency Department (HOSPITAL_BASED_OUTPATIENT_CLINIC_OR_DEPARTMENT_OTHER)
Admission: EM | Admit: 2015-09-28 | Discharge: 2015-09-28 | Disposition: A | Discharge status: Home / Self Care | Payer: Medicaid Other | Attending: Emergency Medicine | Admitting: Emergency Medicine

## 2015-09-28 ENCOUNTER — Encounter (HOSPITAL_BASED_OUTPATIENT_CLINIC_OR_DEPARTMENT_OTHER): Payer: Self-pay

## 2015-09-28 DIAGNOSIS — Z79899 Other long term (current) drug therapy: Secondary | ICD-10-CM | POA: Diagnosis not present

## 2015-09-28 DIAGNOSIS — Z3202 Encounter for pregnancy test, result negative: Secondary | ICD-10-CM | POA: Insufficient documentation

## 2015-09-28 DIAGNOSIS — Z87891 Personal history of nicotine dependence: Secondary | ICD-10-CM | POA: Insufficient documentation

## 2015-09-28 DIAGNOSIS — R102 Pelvic and perineal pain: Secondary | ICD-10-CM

## 2015-09-28 DIAGNOSIS — A5901 Trichomonal vulvovaginitis: Secondary | ICD-10-CM | POA: Insufficient documentation

## 2015-09-28 DIAGNOSIS — R1030 Lower abdominal pain, unspecified: Secondary | ICD-10-CM | POA: Diagnosis present

## 2015-09-28 DIAGNOSIS — A599 Trichomoniasis, unspecified: Secondary | ICD-10-CM

## 2015-09-28 LAB — URINALYSIS, ROUTINE W REFLEX MICROSCOPIC
Glucose, UA: NEGATIVE mg/dL
Ketones, ur: 15 mg/dL — AB
Nitrite: NEGATIVE
PH: 5.5 (ref 5.0–8.0)
Protein, ur: >300 mg/dL — AB
SPECIFIC GRAVITY, URINE: 1.034 — AB (ref 1.005–1.030)

## 2015-09-28 LAB — URINE MICROSCOPIC-ADD ON

## 2015-09-28 LAB — PREGNANCY, URINE: Preg Test, Ur: NEGATIVE

## 2015-09-28 LAB — WET PREP, GENITAL
Clue Cells Wet Prep HPF POC: NONE SEEN
Sperm: NONE SEEN
Yeast Wet Prep HPF POC: NONE SEEN

## 2015-09-28 MED ORDER — METRONIDAZOLE 500 MG PO TABS
2000.0000 mg | ORAL_TABLET | Freq: Once | ORAL | Status: AC
  Administered 2015-09-28: 2000 mg via ORAL
  Filled 2015-09-28: qty 4

## 2015-09-28 MED ORDER — AZITHROMYCIN 1 G PO PACK
1.0000 g | PACK | Freq: Once | ORAL | Status: AC
  Administered 2015-09-28: 1 g via ORAL
  Filled 2015-09-28: qty 1

## 2015-09-28 MED ORDER — CEFTRIAXONE SODIUM 250 MG IJ SOLR
250.0000 mg | Freq: Once | INTRAMUSCULAR | Status: AC
  Administered 2015-09-28: 250 mg via INTRAMUSCULAR
  Filled 2015-09-28: qty 250

## 2015-09-28 MED ORDER — KETOROLAC TROMETHAMINE 60 MG/2ML IM SOLN
60.0000 mg | Freq: Once | INTRAMUSCULAR | Status: AC
  Administered 2015-09-28: 60 mg via INTRAMUSCULAR
  Filled 2015-09-28: qty 2

## 2015-09-28 NOTE — ED Notes (Signed)
Pt c/o lower abdominal pain since this afternoon with heavier menstrual bleeding than normal.  Pt states the pain radiates down both legs, took Advil PM earlier today without relief.

## 2015-09-28 NOTE — Discharge Instructions (Signed)
Trichomonas Test °The trichomonas test is done to diagnose trichomoniasis, an infection caused by an organism called Trichomonas. Trichomoniasis is a sexually transmitted infection (STI). In women, it causes vaginal infections. In men, it can cause the tube that carries urine (urethra) to become inflamed (urethritis). °You may have this test as a part of a routine screening for STIs or if you have symptoms of trichomoniasis. To perform the test, your health care provider will take a sample of discharge. The sample is taken from the vagina or cervix in women and from the urethra in men. A urine sample can also be used for testing. °RESULTS °It is your responsibility to obtain your test results. Ask the lab or department performing the test when and how you will get your results. Contact your health care provider to discuss any questions you have about your results.  °Meaning of Negative Test Results  °A negative test means you do not have trichomoniasis. Follow your health care provider's directions about any follow-up testing.  °Meaning of Positive Test Results  °A positive test result means you have an active infection that needs to be treated with antibiotic medicine. All your current sexual partners must also be treated or it is likely you will get reinfected.  °If your test is positive, your health care provider will start you on medicine and may advise you to: °· Not have sexual intercourse until your infection has cleared up. °· Use a latex condom properly every time you have sexual intercourse. °· Limit the number of sexual partners you have. The more partners you have, the greater your risk of contracting trichomoniasis or another STI. °· Tell all sexual partners about your infection so they can also be treated and to prevent reinfection. °  °This information is not intended to replace advice given to you by your health care provider. Make sure you discuss any questions you have with your health care  provider. °  °Document Released: 09/29/2004 Document Revised: 09/17/2014 Document Reviewed: 09/08/2013 °Elsevier Interactive Patient Education ©2016 [employer] ° °Trichomoniasis °Trichomoniasis is an infection caused by an organism called Trichomonas. The infection can affect both women and men. In women, the outer female genitalia and the vagina are affected. In men, the penis is mainly affected, but the prostate and other reproductive organs can also be involved. Trichomoniasis is a sexually transmitted infection (STI) and is most often passed to another person through sexual contact.  °RISK FACTORS °· Having unprotected sexual intercourse. °· Having sexual intercourse with an infected partner. °SIGNS AND SYMPTOMS  °Symptoms of trichomoniasis in women include: °· Abnormal gray-green frothy vaginal discharge. °· Itching and irritation of the vagina. °· Itching and irritation of the area outside the vagina. °Symptoms of trichomoniasis in men include:  °· Penile discharge with or without pain. °· Pain during urination. This results from inflammation of the urethra. °DIAGNOSIS  °Trichomoniasis may be found during a Pap test or physical exam. Your health care provider may use one of the following methods to help diagnose this infection: °· Testing the pH of the vagina with a test tape. °· Using a vaginal swab test that checks for the Trichomonas organism. A test is available that provides results within a few minutes. °· Examining a urine sample. °· Testing vaginal secretions. °Your health care provider may test you for other STIs, including HIV. °TREATMENT  °· You may be given medicine to fight the infection. Women should inform their health care provider if they could be or are pregnant. Some   medicines used to treat the infection should not be taken during pregnancy. °· Your health care provider may recommend over-the-counter medicines or creams to decrease itching or irritation. °· Your sexual partner will need  to be treated if infected. °· Your health care provider may test you for infection again 3 months after treatment. °HOME CARE INSTRUCTIONS  °· Take medicines only as directed by your health care provider. °· Take over-the-counter medicine for itching or irritation as directed by your health care provider. °· Do not have sexual intercourse while you have the infection. °· Women should not douche or wear tampons while they have the infection. °· Discuss your infection with your partner. Your partner may have gotten the infection from you, or you may have gotten it from your partner. °· Have your sex partner get examined and treated if necessary. °· Practice safe, informed, and protected sex. °· See your health care provider for other STI testing. °SEEK MEDICAL CARE IF:  °· You still have symptoms after you finish your medicine. °· You develop abdominal pain. °· You have pain when you urinate. °· You have bleeding after sexual intercourse. °· You develop a rash. °· Your medicine makes you sick or makes you throw up (vomit). °MAKE SURE YOU: °· Understand these instructions. °· Will watch your condition. °· Will get help right away if you are not doing well or get worse. °  °This information is not intended to replace advice given to you by your health care provider. Make sure you discuss any questions you have with your health care provider. °  °Document Released: 02/20/2001 Document Revised: 09/17/2014 Document Reviewed: 06/08/2013 °Elsevier Interactive Patient Education ©2016 Elsevier Inc. ° °

## 2015-09-28 NOTE — ED Notes (Signed)
Pt verbalizes understanding of d/c instructions and denies any further needs at this time. 

## 2015-09-29 ENCOUNTER — Encounter (HOSPITAL_BASED_OUTPATIENT_CLINIC_OR_DEPARTMENT_OTHER): Payer: Self-pay | Admitting: Emergency Medicine

## 2015-09-29 LAB — GC/CHLAMYDIA PROBE AMP (~~LOC~~) NOT AT ARMC
CHLAMYDIA, DNA PROBE: NEGATIVE
NEISSERIA GONORRHEA: NEGATIVE

## 2015-09-29 NOTE — ED Provider Notes (Signed)
CSN: [id number]     Arrival date & time 09/28/15  0052 History   First MD Initiated Contact with Patient 09/28/15 0159     Chief Complaint  Patient presents with  . Abdominal Pain     (Consider location/radiation/quality/duration/timing/severity/associated sxs/prior Treatment) Patient is a 45 y.o. female presenting with abdominal pain.  Abdominal Pain Pain location:  Suprapubic Pain quality: cramping   Pain radiates to:  Does not radiate Pain severity:  Moderate Onset quality:  Gradual Duration:  2 days Timing:  Constant Progression:  Unchanged Chronicity:  Recurrent Context: not recent travel   Relieved by:  Nothing Worsened by:  Nothing tried Ineffective treatments:  None tried Associated symptoms: vaginal bleeding   Associated symptoms: no chest pain, no diarrhea, no dysuria, no fever, no hematuria and no nausea   Risk factors: not pregnant     History reviewed. No pertinent past medical history. Past Surgical History  Procedure Laterality Date  . Knee surgery      Left Knee  . Tubal ligation     History reviewed. No pertinent family history. Social History  Substance Use Topics  . Smoking status: Former Smoker -- 0.50 packs/day for 10 years    Types: Cigarettes  . Smokeless tobacco: Never Used  . Alcohol Use: No   OB History    No data available     Review of Systems  Constitutional: Negative for fever.  Cardiovascular: Negative for chest pain.  Gastrointestinal: Positive for abdominal pain. Negative for nausea and diarrhea.  Genitourinary: Positive for vaginal bleeding. Negative for dysuria, hematuria and genital sores.  All other systems reviewed and are negative.     Allergies  Review of patient's allergies indicates no known allergies.  Home Medications   Prior to Admission medications   Medication Sig Start Date End Date Taking? Authorizing Provider  ferrous sulfate 325 (65 FE) MG tablet Take 1 tablet (325 mg total) by mouth 3 (three) times  daily with meals. 05/01/15   Nicole Pisciotta, PA-C  ibuprofen (ADVIL,MOTRIN) 600 MG tablet Take 1 tablet (600 mg total) by mouth every 6 (six) hours as needed. 10/06/14   Arby Barrette, MD  pantoprazole (PROTONIX) 20 MG tablet Take 1 tablet (20 mg total) by mouth daily. 08/14/15   Elpidio Anis, PA-C  promethazine (PHENERGAN) 25 MG tablet Take 1 tablet (25 mg total) by mouth every 6 (six) hours as needed for nausea or vomiting. 05/01/15   Joni Reining Pisciotta, PA-C  sucralfate (CARAFATE) 1 G tablet Take 1 tablet (1 g total) by mouth 4 (four) times daily -  with meals and at bedtime. 08/14/15   Elpidio Anis, PA-C   BP 117/89 mmHg  Pulse 66  Temp(Src) 98.1 F (36.7 C) (Oral)  Resp 18  Ht  (1.6 m)  Wt 190 lb (86.183 kg)  BMI 33.67 kg/m2  SpO2 100%  LMP 09/25/2015 Physical Exam  Constitutional: She appears well-developed and well-nourished. No distress.  HENT:  Head: Normocephalic and atraumatic.  Mouth/Throat: Oropharynx is clear and moist.  Eyes: Conjunctivae are normal. Pupils are equal, round, and reactive to light.  Neck: Normal range of motion. Neck supple.  Cardiovascular: Normal rate, regular rhythm and intact distal pulses.   Pulmonary/Chest: Effort normal and breath sounds normal. No respiratory distress. She has no wheezes. She has no rales.  Abdominal: Soft. Bowel sounds are normal. There is no tenderness. There is no rebound and no guarding.  Genitourinary: Cervix exhibits no motion tenderness. Right adnexum displays no mass. Left adnexum  displays no mass.  Chaperone present scant vaginal bleeding    ED Course  Procedures (including critical care time) Labs Review Labs Reviewed  WET PREP, GENITAL - Abnormal; Notable for the following:    Trich, Wet Prep PRESENT (*)    WBC, Wet Prep HPF POC FEW (*)    All other components within normal limits  URINALYSIS, ROUTINE W REFLEX MICROSCOPIC (NOT AT [facility]) - Abnormal; Notable for the following:    Color, Urine RED (*)     APPearance TURBID (*)    Specific Gravity, Urine 1.034 (*)    Hgb urine dipstick LARGE (*)    Bilirubin Urine SMALL (*)    Ketones, ur 15 (*)    Protein, ur >300 (*)    Leukocytes, UA SMALL (*)    All other components within normal limits  URINE MICROSCOPIC-ADD ON - Abnormal; Notable for the following:    Squamous Epithelial / LPF 6-30 (*)    Bacteria, UA FEW (*)    All other components within normal limits  PREGNANCY, URINE  GC/CHLAMYDIA PROBE AMP ([place]) NOT AT [facility]    Imaging Review No results found. I have personally reviewed and evaluated these images and lab results as part of my medical decision-making.   EKG Interpretation None      MDM   Final diagnoses:  Pelvic cramping  Trichomoniasis    Results for orders placed or performed during the hospital encounter of 09/28/15  Wet prep, genital  Result Value Ref Range   Yeast Wet Prep HPF POC NONE SEEN NONE SEEN   Trich, Wet Prep PRESENT (A) NONE SEEN   Clue Cells Wet Prep HPF POC NONE SEEN NONE SEEN   WBC, Wet Prep HPF POC FEW (A) NONE SEEN   Sperm NONE SEEN   Pregnancy, urine  Result Value Ref Range   Preg Test, Ur NEGATIVE NEGATIVE  Urinalysis, Routine w reflex microscopic (not at [facility])  Result Value Ref Range   Color, Urine RED (A) YELLOW   APPearance TURBID (A) CLEAR   Specific Gravity, Urine 1.034 (H) 1.005 - 1.030   pH 5.5 5.0 - 8.0   Glucose, UA NEGATIVE NEGATIVE mg/dL   Hgb urine dipstick LARGE (A) NEGATIVE   Bilirubin Urine SMALL (A) NEGATIVE   Ketones, ur 15 (A) NEGATIVE mg/dL   Protein, ur >161 (A) NEGATIVE mg/dL   Nitrite NEGATIVE NEGATIVE   Leukocytes, UA SMALL (A) NEGATIVE  Urine microscopic-add on  Result Value Ref Range   Squamous Epithelial / LPF 6-30 (A) NONE SEEN   WBC, UA 6-30 0 - 5 WBC/hpf   RBC / HPF TOO NUMEROUS TO COUNT 0 - 5 RBC/hpf   Bacteria, UA FEW (A) NONE SEEN   Urine-Other MUCOUS PRESENT    No results found.  Medications  ketorolac (TORADOL) injection 60 mg (60  mg Intramuscular Given 09/28/15 0241)  cefTRIAXone (ROCEPHIN) injection 250 mg (250 mg Intramuscular Given 09/28/15 0401)  azithromycin (ZITHROMAX) powder 1 g (1 g Oral Given 09/28/15 0400)  metroNIDAZOLE (FLAGYL) tablet 2,000 mg (2,000 mg Oral Given 09/28/15 0400)    All partners must be treated.  No sexual activity of any kind until seven days after all partners treated follow up with your GYN for ongoing management of menstrual bleeding.  Patient verbalizes understanding and agrees to follow up   Stephaniemarie Stoffel, MD 09/29/15 0960

## 2015-10-31 ENCOUNTER — Emergency Department (HOSPITAL_BASED_OUTPATIENT_CLINIC_OR_DEPARTMENT_OTHER)
Admission: EM | Admit: 2015-10-31 | Discharge: 2015-10-31 | Disposition: A | Discharge status: Home / Self Care | Payer: Medicaid Other | Attending: Emergency Medicine | Admitting: Emergency Medicine

## 2015-10-31 ENCOUNTER — Encounter (HOSPITAL_BASED_OUTPATIENT_CLINIC_OR_DEPARTMENT_OTHER): Payer: Self-pay | Admitting: *Deleted

## 2015-10-31 DIAGNOSIS — B349 Viral infection, unspecified: Secondary | ICD-10-CM | POA: Diagnosis not present

## 2015-10-31 DIAGNOSIS — R197 Diarrhea, unspecified: Secondary | ICD-10-CM | POA: Diagnosis not present

## 2015-10-31 DIAGNOSIS — Z79899 Other long term (current) drug therapy: Secondary | ICD-10-CM | POA: Insufficient documentation

## 2015-10-31 DIAGNOSIS — Z87891 Personal history of nicotine dependence: Secondary | ICD-10-CM | POA: Diagnosis not present

## 2015-10-31 DIAGNOSIS — R0981 Nasal congestion: Secondary | ICD-10-CM | POA: Diagnosis present

## 2015-10-31 DIAGNOSIS — R112 Nausea with vomiting, unspecified: Secondary | ICD-10-CM | POA: Diagnosis not present

## 2015-10-31 MED ORDER — ACETAMINOPHEN 500 MG PO TABS: 1000.0000 mg | ORAL_TABLET | Freq: Once | ORAL | Status: DC

## 2015-10-31 MED ORDER — LOPERAMIDE HCL 2 MG PO CAPS
4.0000 mg | ORAL_CAPSULE | Freq: Once | ORAL | Status: AC
  Administered 2015-10-31: 4 mg via ORAL
  Filled 2015-10-31: qty 2

## 2015-10-31 MED ORDER — ONDANSETRON 8 MG PO TBDP
8.0000 mg | ORAL_TABLET | Freq: Once | ORAL | Status: AC
  Administered 2015-10-31: 8 mg via ORAL
  Filled 2015-10-31: qty 1

## 2015-10-31 MED ORDER — DICYCLOMINE HCL 20 MG PO TABS: 20.0000 mg | ORAL_TABLET | Freq: Two times a day (BID) | ORAL | Status: DC

## 2015-10-31 MED ORDER — ACETAMINOPHEN 500 MG PO TABS
1000.0000 mg | ORAL_TABLET | Freq: Once | ORAL | Status: AC
  Administered 2015-10-31: 1000 mg via ORAL
  Filled 2015-10-31: qty 2

## 2015-10-31 MED ORDER — DICYCLOMINE HCL 10 MG/ML IM SOLN
20.0000 mg | Freq: Once | INTRAMUSCULAR | Status: AC
  Administered 2015-10-31: 20 mg via INTRAMUSCULAR
  Filled 2015-10-31: qty 2

## 2015-10-31 MED ORDER — ONDANSETRON 8 MG PO TBDP: ORAL_TABLET | ORAL | Status: DC

## 2015-10-31 NOTE — ED Provider Notes (Signed)
CSN: [id number]     Arrival date & time 10/31/15  1191 History   None    Chief Complaint  Patient presents with  . URI     (Consider location/radiation/quality/duration/timing/severity/associated sxs/prior Treatment) Patient is a 45 y.o. female presenting with URI. The history is provided by the patient.  URI Presenting symptoms: congestion and rhinorrhea   Presenting symptoms: no fever   Severity:  Mild Onset quality:  Gradual Duration:  1 day Timing:  Constant Progression:  Unchanged Chronicity:  New Relieved by:  Nothing Worsened by:  Nothing tried Ineffective treatments:  None tried Associated symptoms: no arthralgias and no wheezing   Associated symptoms comment:  Then developed a few episodes of vomiting and diarrhea last night.   Risk factors: not elderly     History reviewed. No pertinent past medical history. Past Surgical History  Procedure Laterality Date  . Knee surgery      Left Knee  . Tubal ligation     History reviewed. No pertinent family history. Social History  Substance Use Topics  . Smoking status: Former Smoker -- 0.50 packs/day for 10 years    Types: Cigarettes  . Smokeless tobacco: Never Used  . Alcohol Use: No   OB History    No data available     Review of Systems  Constitutional: Negative for fever and diaphoresis.  HENT: Positive for congestion and rhinorrhea.   Respiratory: Negative for wheezing.   Gastrointestinal: Positive for nausea, vomiting and diarrhea.  Musculoskeletal: Negative for arthralgias.  All other systems reviewed and are negative.     Allergies  Review of patient's allergies indicates no known allergies.  Home Medications   Prior to Admission medications   Medication Sig Start Date End Date Taking? Authorizing Provider  dicyclomine (BENTYL) 20 MG tablet Take 1 tablet (20 mg total) by mouth 2 (two) times daily. 10/31/15   Deanne Bedgood, MD  ferrous sulfate 325 (65 FE) MG tablet Take 1 tablet (325 mg total)  by mouth 3 (three) times daily with meals. 05/01/15   Nicole Pisciotta, PA-C  ibuprofen (ADVIL,MOTRIN) 600 MG tablet Take 1 tablet (600 mg total) by mouth every 6 (six) hours as needed. 10/06/14   Arby Barrette, MD  ondansetron (ZOFRAN ODT) 8 MG disintegrating tablet  ODT q8 hours prn nausea 10/31/15   Zillah Alexie, MD  pantoprazole (PROTONIX) 20 MG tablet Take 1 tablet (20 mg total) by mouth daily. 08/14/15   Elpidio Anis, PA-C  promethazine (PHENERGAN) 25 MG tablet Take 1 tablet (25 mg total) by mouth every 6 (six) hours as needed for nausea or vomiting. 05/01/15   Joni Reining Pisciotta, PA-C  sucralfate (CARAFATE) 1 G tablet Take 1 tablet (1 g total) by mouth 4 (four) times daily -  with meals and at bedtime. 08/14/15   Shari Upstill, PA-C   BP 124/79 mmHg  Pulse 63  Temp(Src) 98.1 F (36.7 C) (Oral)  Resp 18  Ht  (1.6 m)  Wt 190 lb (86.183 kg)  BMI 33.67 kg/m2  SpO2 99%  LMP 09/25/2015 Physical Exam  Constitutional: She is oriented to person, place, and time. She appears well-developed and well-nourished. No distress.  HENT:  Head: Normocephalic and atraumatic.  Mouth/Throat: Oropharynx is clear and moist.  Eyes: Conjunctivae are normal. Pupils are equal, round, and reactive to light.  Neck: Normal range of motion. Neck supple.  Cardiovascular: Normal rate, regular rhythm and intact distal pulses.   Pulmonary/Chest: Effort normal and breath sounds normal. No respiratory distress. She  has no wheezes. She has no rales.  Abdominal: Soft. Bowel sounds are normal. There is no tenderness. There is no rebound and no guarding.  Musculoskeletal: Normal range of motion.  Lymphadenopathy:    She has no cervical adenopathy.  Neurological: She is alert and oriented to person, place, and time.  Skin: Skin is warm and dry.  Psychiatric: She has a normal mood and affect.    ED Course  Procedures (including critical care time) Labs Review Labs Reviewed - No data to display  Imaging  Review No results found. I have personally reviewed and evaluated these images and lab results as part of my medical decision-making.   EKG Interpretation None      MDM   Final diagnoses:  Viral syndrome    Symptoms viral in nature.  Exam and vitals are benign and reassuring.  Will treat symptomatically.  PO challenged successfully in the ED stable for discharge.  Strict return precautions given    Mahoganie Basher, MD 10/31/15 1610

## 2015-10-31 NOTE — ED Notes (Signed)
Denies questions or needs, steady gait, alert, NAD, calm, interactive.

## 2015-10-31 NOTE — ED Notes (Signed)
EDP in to see pt, at BS.  °

## 2015-11-17 ENCOUNTER — Emergency Department (HOSPITAL_BASED_OUTPATIENT_CLINIC_OR_DEPARTMENT_OTHER)
Admission: EM | Admit: 2015-11-17 | Discharge: 2015-11-17 | Disposition: A | Discharge status: Home / Self Care | Payer: Medicaid Other | Attending: Emergency Medicine | Admitting: Emergency Medicine

## 2015-11-17 ENCOUNTER — Encounter (HOSPITAL_BASED_OUTPATIENT_CLINIC_OR_DEPARTMENT_OTHER): Payer: Self-pay | Admitting: Emergency Medicine

## 2015-11-17 ENCOUNTER — Emergency Department (HOSPITAL_BASED_OUTPATIENT_CLINIC_OR_DEPARTMENT_OTHER): Payer: Medicaid Other

## 2015-11-17 DIAGNOSIS — R197 Diarrhea, unspecified: Secondary | ICD-10-CM | POA: Diagnosis not present

## 2015-11-17 DIAGNOSIS — Z79899 Other long term (current) drug therapy: Secondary | ICD-10-CM | POA: Insufficient documentation

## 2015-11-17 DIAGNOSIS — Z3202 Encounter for pregnancy test, result negative: Secondary | ICD-10-CM | POA: Diagnosis not present

## 2015-11-17 DIAGNOSIS — J189 Pneumonia, unspecified organism: Secondary | ICD-10-CM

## 2015-11-17 DIAGNOSIS — F17210 Nicotine dependence, cigarettes, uncomplicated: Secondary | ICD-10-CM | POA: Diagnosis not present

## 2015-11-17 DIAGNOSIS — R05 Cough: Secondary | ICD-10-CM | POA: Diagnosis present

## 2015-11-17 DIAGNOSIS — J159 Unspecified bacterial pneumonia: Secondary | ICD-10-CM | POA: Insufficient documentation

## 2015-11-17 LAB — PREGNANCY, URINE: PREG TEST UR: NEGATIVE

## 2015-11-17 MED ORDER — AZITHROMYCIN 250 MG PO TABS
500.0000 mg | ORAL_TABLET | Freq: Once | ORAL | Status: AC
  Administered 2015-11-17: 500 mg via ORAL
  Filled 2015-11-17: qty 2

## 2015-11-17 MED ORDER — BENZONATATE 100 MG PO CAPS: 100.0000 mg | ORAL_CAPSULE | Freq: Three times a day (TID) | ORAL | Status: DC

## 2015-11-17 MED ORDER — AZITHROMYCIN 250 MG PO TABS: 250.0000 mg | ORAL_TABLET | Freq: Every day | ORAL | Status: DC

## 2015-11-17 MED ORDER — ONDANSETRON 8 MG PO TBDP
8.0000 mg | ORAL_TABLET | Freq: Once | ORAL | Status: AC
  Administered 2015-11-17: 8 mg via ORAL
  Filled 2015-11-17: qty 1

## 2015-11-17 MED ORDER — GUAIFENESIN ER 1200 MG PO TB12: 1.0000 | ORAL_TABLET | Freq: Two times a day (BID) | ORAL | Status: DC

## 2015-11-17 MED ORDER — IBUPROFEN 800 MG PO TABS
800.0000 mg | ORAL_TABLET | Freq: Once | ORAL | Status: AC
  Administered 2015-11-17: 800 mg via ORAL
  Filled 2015-11-17: qty 1

## 2015-11-17 MED ORDER — ONDANSETRON 4 MG PO TBDP
4.0000 mg | ORAL_TABLET | Freq: Once | ORAL | Status: DC
  Filled 2015-11-17: qty 1

## 2015-11-17 MED ORDER — ACETAMINOPHEN 500 MG PO TABS
1000.0000 mg | ORAL_TABLET | Freq: Once | ORAL | Status: AC
  Administered 2015-11-17: 1000 mg via ORAL
  Filled 2015-11-17: qty 2

## 2015-11-17 NOTE — ED Notes (Signed)
Cough, congestion x 3 days w n/v x 1 day

## 2015-11-17 NOTE — ED Provider Notes (Signed)
CSN: [id number]     Arrival date & time 11/17/15  0014 History   First MD Initiated Contact with Patient 11/17/15 0037     Chief Complaint  Patient presents with  . Cough  . Nausea     (Consider location/radiation/quality/duration/timing/severity/associated sxs/prior Treatment) Patient is a 45 y.o. female presenting with cough. The history is provided by the patient.  Cough Cough characteristics:  Non-productive Severity:  Moderate Onset quality:  Gradual Duration:  1 day Timing:  Sporadic Progression:  Unchanged Chronicity:  New Context: not animal exposure   Relieved by:  Nothing Worsened by:  Nothing tried Ineffective treatments:  None tried Associated symptoms: sinus congestion   Associated symptoms: no chest pain   Associated symptoms comment:  Also vomiting and diarrhea Risk factors: no chemical exposure     History reviewed. No pertinent past medical history. Past Surgical History  Procedure Laterality Date  . Knee surgery      Left Knee  . Tubal ligation     History reviewed. No pertinent family history. Social History  Substance Use Topics  . Smoking status: Light Tobacco Smoker -- 0.50 packs/day for 10 years    Types: Cigarettes  . Smokeless tobacco: Never Used  . Alcohol Use: No   OB History    No data available     Review of Systems  Respiratory: Positive for cough.   Cardiovascular: Negative for chest pain.  Gastrointestinal: Positive for nausea, vomiting and diarrhea.  All other systems reviewed and are negative.     Allergies  Review of patient's allergies indicates no known allergies.  Home Medications   Prior to Admission medications   Medication Sig Start Date End Date Taking? Authorizing Provider  dicyclomine (BENTYL) 20 MG tablet Take 1 tablet (20 mg total) by mouth 2 (two) times daily. 10/31/15   Idonna Heeren, MD  ferrous sulfate 325 (65 FE) MG tablet Take 1 tablet (325 mg total) by mouth 3 (three) times daily with meals. 05/01/15    Nicole Pisciotta, PA-C  ibuprofen (ADVIL,MOTRIN) 600 MG tablet Take 1 tablet (600 mg total) by mouth every 6 (six) hours as needed. 10/06/14   Arby BarretteMarcy Pfeiffer, MD  ondansetron (ZOFRAN ODT) 8 MG disintegrating tablet 8mg  ODT q8 hours prn nausea 10/31/15   Lorae Roig, MD  pantoprazole (PROTONIX) 20 MG tablet Take 1 tablet (20 mg total) by mouth daily. 08/14/15   Elpidio AnisShari Upstill, PA-C  promethazine (PHENERGAN) 25 MG tablet Take 1 tablet (25 mg total) by mouth every 6 (six) hours as needed for nausea or vomiting. 05/01/15   Joni ReiningNicole Pisciotta, PA-C  sucralfate (CARAFATE) 1 G tablet Take 1 tablet (1 g total) by mouth 4 (four) times daily -  with meals and at bedtime. 08/14/15   Shari Upstill, PA-C   BP 138/84 mmHg  Pulse 83  Temp(Src) 98.4 F (36.9 C) (Oral)  Resp 18  Ht 5\' 3"  (1.6 m)  Wt 190 lb (86.183 kg)  BMI 33.67 kg/m2  SpO2 99%  LMP 09/25/2015 Physical Exam  Constitutional: She is oriented to person, place, and time. She appears well-developed and well-nourished. No distress.  HENT:  Head: Normocephalic and atraumatic.  Mouth/Throat: Oropharynx is clear and moist.  Eyes: Conjunctivae are normal. Pupils are equal, round, and reactive to light.  Neck: Normal range of motion. Neck supple.  Cardiovascular: Normal rate, regular rhythm and intact distal pulses.   Pulmonary/Chest: Effort normal and breath sounds normal. No respiratory distress. She has no wheezes. She has no rales.  Abdominal:  Soft. Bowel sounds are normal. There is no tenderness. There is no rebound and no guarding.  Musculoskeletal: Normal range of motion.  Neurological: She is alert and oriented to person, place, and time.  Skin: Skin is warm and dry.  Psychiatric: She has a normal mood and affect.    ED Course  Procedures (including critical care time) Labs Review Labs Reviewed  PREGNANCY, URINE    Imaging Review No results found. I have personally reviewed and evaluated these images and lab results as part of my  medical decision-making.   EKG Interpretation None      MDM   Final diagnoses:  None    Will treat with Zpak.  Follow up chest Xray in 3 weeks to ensure resolution of area seen on CXR.  Patient verbalizes understanding and agrees to follow up.  This is documented on your discharge paperwork.  Strict return precautions given.       Cy Blamer, MD 11/17/15 0140

## 2015-11-17 NOTE — ED Notes (Signed)
45 yo c/o cough and congestion x 3 days with N/V.

## 2015-11-26 ENCOUNTER — Emergency Department (HOSPITAL_BASED_OUTPATIENT_CLINIC_OR_DEPARTMENT_OTHER)
Admission: EM | Admit: 2015-11-26 | Discharge: 2015-11-26 | Disposition: A | Discharge status: Home / Self Care | Payer: Medicaid Other | Attending: Emergency Medicine | Admitting: Emergency Medicine

## 2015-11-26 ENCOUNTER — Encounter (HOSPITAL_BASED_OUTPATIENT_CLINIC_OR_DEPARTMENT_OTHER): Payer: Self-pay | Admitting: *Deleted

## 2015-11-26 DIAGNOSIS — Z79899 Other long term (current) drug therapy: Secondary | ICD-10-CM | POA: Insufficient documentation

## 2015-11-26 DIAGNOSIS — J069 Acute upper respiratory infection, unspecified: Secondary | ICD-10-CM | POA: Insufficient documentation

## 2015-11-26 DIAGNOSIS — F17210 Nicotine dependence, cigarettes, uncomplicated: Secondary | ICD-10-CM | POA: Insufficient documentation

## 2015-11-26 DIAGNOSIS — R05 Cough: Secondary | ICD-10-CM | POA: Diagnosis present

## 2015-11-26 MED ORDER — GI COCKTAIL ~~LOC~~
30.0000 mL | Freq: Once | ORAL | Status: AC
  Administered 2015-11-26: 30 mL via ORAL
  Filled 2015-11-26: qty 30

## 2015-11-26 MED ORDER — ONDANSETRON 8 MG PO TBDP
8.0000 mg | ORAL_TABLET | Freq: Once | ORAL | Status: AC
  Administered 2015-11-26: 8 mg via ORAL
  Filled 2015-11-26: qty 1

## 2015-11-26 MED ORDER — ALBUTEROL SULFATE HFA 108 (90 BASE) MCG/ACT IN AERS: 1.0000 | INHALATION_SPRAY | Freq: Four times a day (QID) | RESPIRATORY_TRACT | Status: AC | PRN

## 2015-11-26 MED ORDER — FLUTICASONE PROPIONATE 50 MCG/ACT NA SUSP: 2.0000 | Freq: Every day | NASAL | Status: AC

## 2015-11-26 NOTE — ED Notes (Signed)
Pt verbalizes understanding of d/c instructions and denies any further needs at this time. 

## 2015-11-26 NOTE — ED Notes (Signed)
Cough and congestion x 1 day

## 2015-11-26 NOTE — ED Provider Notes (Signed)
CSN: [id number]     Arrival date & time 11/26/15  0034 History   First MD Initiated Contact with Patient 11/26/15 0112     Chief Complaint  Patient presents with  . Nasal Congestion  . Cough     (Consider location/radiation/quality/duration/timing/severity/associated sxs/prior Treatment) Patient is a 45 y.o. female presenting with cough. The history is provided by the patient.  Cough Severity:  Moderate Onset quality:  Gradual Duration:  1 day Timing:  Sporadic Progression:  Unchanged Chronicity:  Recurrent Context: not animal exposure   Relieved by:  Nothing Worsened by:  Nothing tried Ineffective treatments:  None tried Associated symptoms: no chest pain, no fever and no shortness of breath   Risk factors: no chemical exposure     History reviewed. No pertinent past medical history. Past Surgical History  Procedure Laterality Date  . Knee surgery      Left Knee  . Tubal ligation     History reviewed. No pertinent family history. Social History  Substance Use Topics  . Smoking status: Light Tobacco Smoker -- 0.50 packs/day for 10 years    Types: Cigarettes  . Smokeless tobacco: Never Used  . Alcohol Use: No   OB History    No data available     Review of Systems  Constitutional: Negative for fever.  HENT: Positive for congestion.   Respiratory: Positive for cough. Negative for shortness of breath.   Cardiovascular: Negative for chest pain.  All other systems reviewed and are negative.     Allergies  Review of patient's allergies indicates no known allergies.  Home Medications   Prior to Admission medications   Medication Sig Start Date End Date Taking? Authorizing Provider  azithromycin (ZITHROMAX) 250 MG tablet Take 1 tablet (250 mg total) by mouth daily. Take first 2 tablets together, then 1 every day until finished. 11/17/15   Goldy Calandra, MD  benzonatate (TESSALON) 100 MG capsule Take 1 capsule (100 mg total) by mouth every 8 (eight) hours. 11/17/15    Tavish Gettis, MD  dicyclomine (BENTYL) 20 MG tablet Take 1 tablet (20 mg total) by mouth 2 (two) times daily. 10/31/15   Keonta Monceaux, MD  ferrous sulfate 325 (65 FE) MG tablet Take 1 tablet (325 mg total) by mouth 3 (three) times daily with meals. 05/01/15   Nicole Pisciotta, PA-C  Guaifenesin 1200 MG TB12 Take 1 tablet (1,200 mg total) by mouth 2 (two) times daily. 11/17/15   Maizie Garno, MD  ibuprofen (ADVIL,MOTRIN) 600 MG tablet Take 1 tablet (600 mg total) by mouth every 6 (six) hours as needed. 10/06/14   Arby BarretteMarcy Pfeiffer, MD  ondansetron (ZOFRAN ODT) 8 MG disintegrating tablet 8mg  ODT q8 hours prn nausea 10/31/15   Alesana Magistro, MD  pantoprazole (PROTONIX) 20 MG tablet Take 1 tablet (20 mg total) by mouth daily. 08/14/15   Elpidio AnisShari Upstill, PA-C  promethazine (PHENERGAN) 25 MG tablet Take 1 tablet (25 mg total) by mouth every 6 (six) hours as needed for nausea or vomiting. 05/01/15   Joni ReiningNicole Pisciotta, PA-C  sucralfate (CARAFATE) 1 G tablet Take 1 tablet (1 g total) by mouth 4 (four) times daily -  with meals and at bedtime. 08/14/15   Shari Upstill, PA-C   BP 137/88 mmHg  Pulse 82  Temp(Src) 97.7 F (36.5 C) (Oral)  Resp 16  Ht 5\' 3"  (1.6 m)  Wt 190 lb (86.183 kg)  BMI 33.67 kg/m2  SpO2 100%  LMP 11/24/2015 Physical Exam  Constitutional: She is oriented to person,  place, and time. She appears well-developed and well-nourished. No distress.  HENT:  Head: Normocephalic and atraumatic.  Mouth/Throat: Oropharynx is clear and moist.  Eyes: Conjunctivae are normal. Pupils are equal, round, and reactive to light.  Neck: Normal range of motion. Neck supple.  Cardiovascular: Normal rate, regular rhythm and intact distal pulses.   Pulmonary/Chest: Effort normal and breath sounds normal. No stridor. No respiratory distress. She has no wheezes. She has no rales.  Abdominal: Soft. Bowel sounds are normal. There is no tenderness. There is no rebound and no guarding.  Musculoskeletal: Normal range of  motion.  Lymphadenopathy:    She has no cervical adenopathy.  Neurological: She is alert and oriented to person, place, and time.  Skin: Skin is warm and dry.  Psychiatric: She has a normal mood and affect.    ED Course  Procedures (including critical care time) Labs Review Labs Reviewed - No data to display  Imaging Review No results found. I have personally reviewed and evaluated these images and lab results as part of my medical decision-making.   EKG Interpretation None      MDM   Final diagnoses:  None    Flonase for congestion and albuterol inhaler PRN    Dawsyn Zurn, MD 11/26/15 0116

## 2015-11-26 NOTE — Discharge Instructions (Signed)
Cool Mist Vaporizers  Vaporizers may help relieve the symptoms of a cough and cold. They add moisture to the air, which helps mucus to become thinner and less sticky. This makes it easier to breathe and cough up secretions. Cool mist vaporizers do not cause serious burns like hot mist vaporizers, which may also be called steamers or humidifiers. Vaporizers have not been proven to help with colds. You should not use a vaporizer if you are allergic to mold.  HOME CARE INSTRUCTIONS  · Follow the package instructions for the vaporizer.  · Do not use anything other than distilled water in the vaporizer.  · Do not run the vaporizer all of the time. This can cause mold or bacteria to grow in the vaporizer.  · Clean the vaporizer after each time it is used.  · Clean and dry the vaporizer well before storing it.  · Stop using the vaporizer if worsening respiratory symptoms develop.     This information is not intended to replace advice given to you by your health care provider. Make sure you discuss any questions you have with your health care provider.     Document Released: 05/24/2004 Document Revised: 09/01/2013 Document Reviewed: 01/14/2013  Elsevier Interactive Patient Education ©2016 [employer]

## 2016-03-22 IMAGING — CR DG CHEST 2V
2 series · 2 of 2 positions shown · non-contrast
Comparison: 02/13/2014

CLINICAL DATA: Cough, headache, body aches since [REDACTED]

EXAM:
CHEST  2 VIEW

[w chest pa]
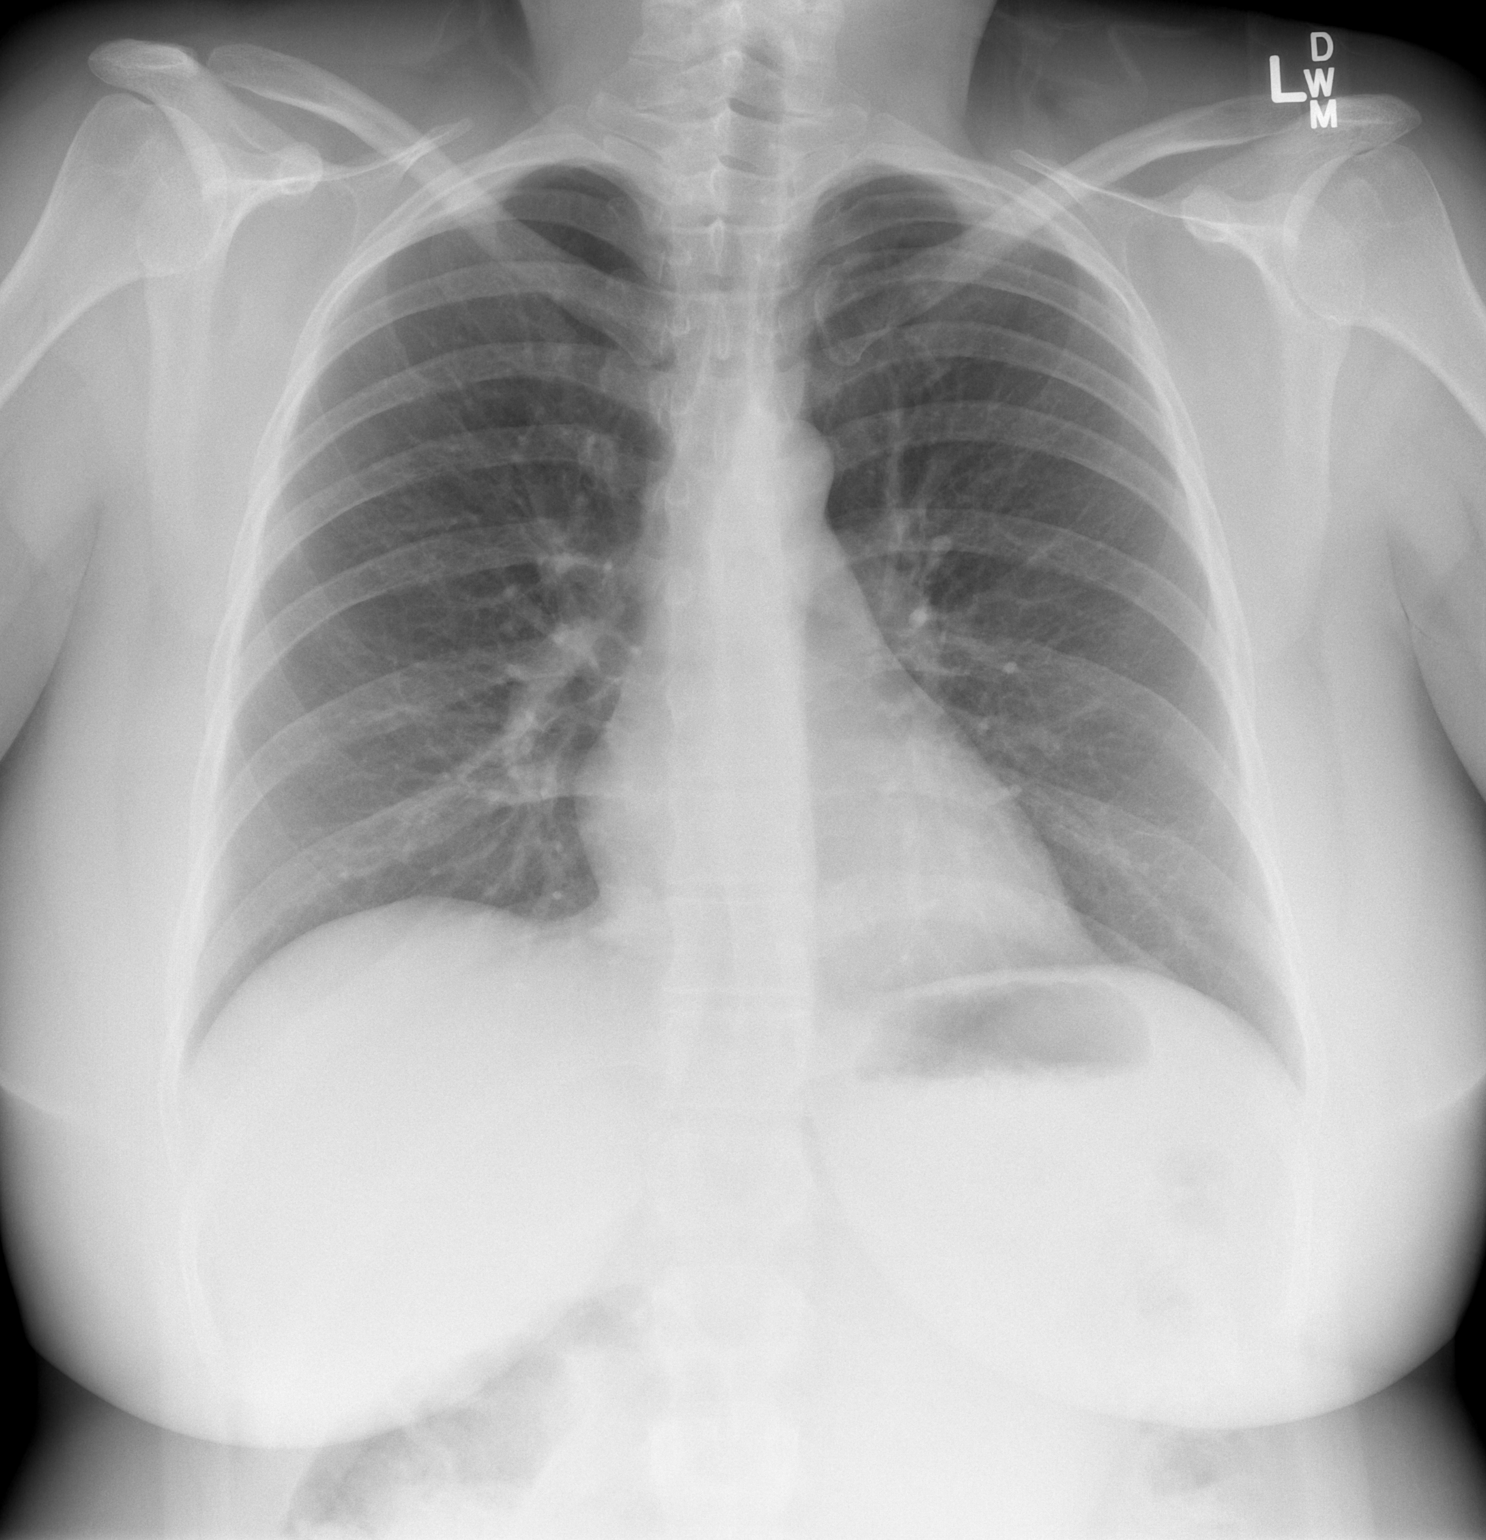

[w chest lat]
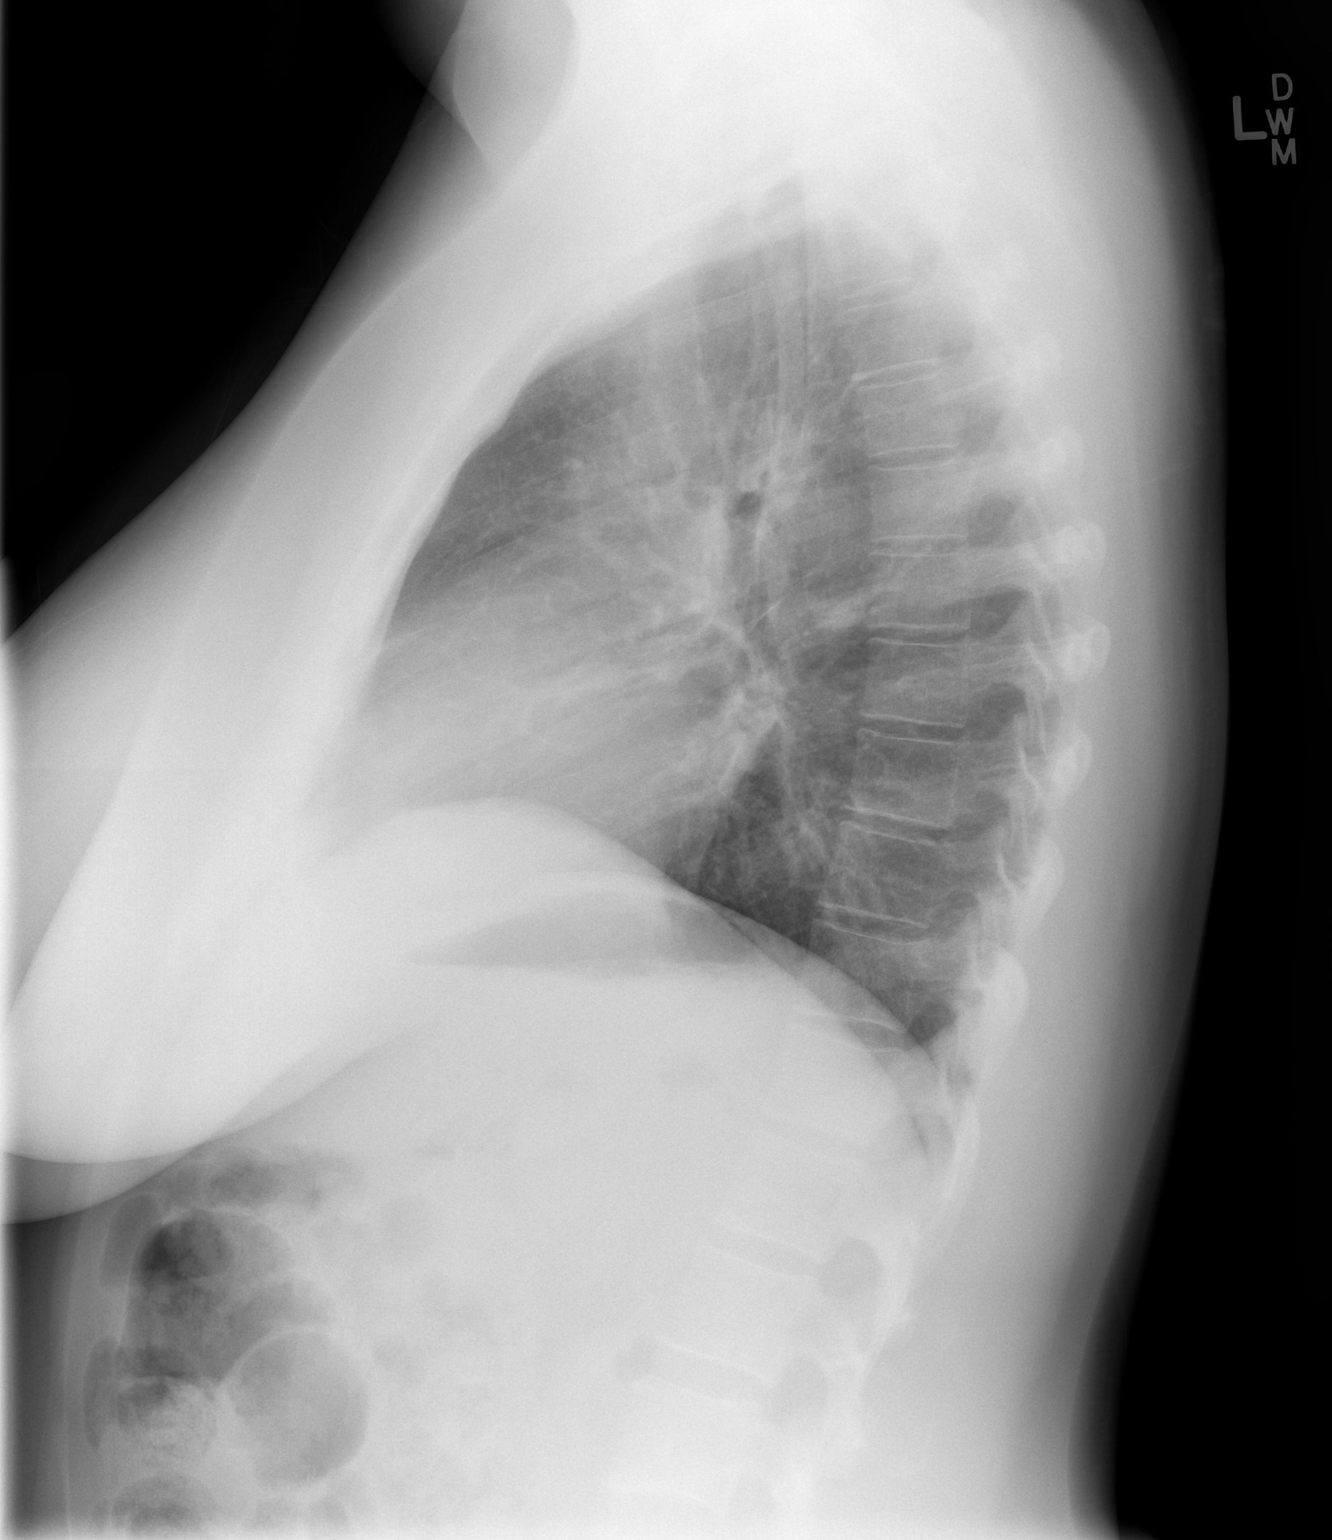

[2 of 2 positions shown; findings below may reference images not displayed]

FINDINGS: The heart size and mediastinal contours are within normal limits.
Both lungs are clear. The visualized skeletal structures are
unremarkable.
IMPRESSION: No active cardiopulmonary disease.

## 2016-04-02 ENCOUNTER — Encounter (HOSPITAL_BASED_OUTPATIENT_CLINIC_OR_DEPARTMENT_OTHER): Payer: Self-pay

## 2016-04-02 ENCOUNTER — Emergency Department (HOSPITAL_BASED_OUTPATIENT_CLINIC_OR_DEPARTMENT_OTHER)
Admission: EM | Admit: 2016-04-02 | Discharge: 2016-04-03 | Disposition: A | Discharge status: Home / Self Care | Payer: Medicaid Other | Attending: Emergency Medicine | Admitting: Emergency Medicine

## 2016-04-02 ENCOUNTER — Emergency Department (HOSPITAL_BASED_OUTPATIENT_CLINIC_OR_DEPARTMENT_OTHER): Payer: Medicaid Other

## 2016-04-02 DIAGNOSIS — Y999 Unspecified external cause status: Secondary | ICD-10-CM | POA: Diagnosis not present

## 2016-04-02 DIAGNOSIS — F17210 Nicotine dependence, cigarettes, uncomplicated: Secondary | ICD-10-CM | POA: Diagnosis not present

## 2016-04-02 DIAGNOSIS — M25572 Pain in left ankle and joints of left foot: Secondary | ICD-10-CM | POA: Diagnosis present

## 2016-04-02 DIAGNOSIS — Z79899 Other long term (current) drug therapy: Secondary | ICD-10-CM | POA: Diagnosis not present

## 2016-04-02 DIAGNOSIS — I10 Essential (primary) hypertension: Secondary | ICD-10-CM | POA: Insufficient documentation

## 2016-04-02 DIAGNOSIS — Y939 Activity, unspecified: Secondary | ICD-10-CM | POA: Diagnosis not present

## 2016-04-02 DIAGNOSIS — S93402A Sprain of unspecified ligament of left ankle, initial encounter: Secondary | ICD-10-CM | POA: Insufficient documentation

## 2016-04-02 DIAGNOSIS — W1839XA Other fall on same level, initial encounter: Secondary | ICD-10-CM | POA: Insufficient documentation

## 2016-04-02 DIAGNOSIS — R200 Anesthesia of skin: Secondary | ICD-10-CM | POA: Insufficient documentation

## 2016-04-02 DIAGNOSIS — Y929 Unspecified place or not applicable: Secondary | ICD-10-CM | POA: Diagnosis not present

## 2016-04-02 HISTORY — DX: Essential (primary) hypertension: I10

## 2016-04-02 MED ORDER — IBUPROFEN 600 MG PO TABS: 600.0000 mg | ORAL_TABLET | Freq: Four times a day (QID) | ORAL | 0 refills | Status: DC | PRN

## 2016-04-02 MED ORDER — HYDROCODONE-ACETAMINOPHEN 5-325 MG PO TABS
1.0000 | ORAL_TABLET | Freq: Once | ORAL | Status: AC
  Administered 2016-04-02: 1 via ORAL
  Filled 2016-04-02: qty 1

## 2016-04-02 NOTE — ED Notes (Addendum)
C/o L ankle pain, rolled ankle laterally at ~ 2015, rates pain 9/10, no meds PTA, skin intact, no redness, heat or bruising noted. some swelling present, CMS intact, decreased ROM d/t pain, ice pack in place, also mentions some n/t in toes, and high ankle pain. Smells of ETOH. Family x2 at [facility]. Remains sitting in w/c.

## 2016-04-02 NOTE — ED Triage Notes (Signed)
Fall approx 830pm-pain to left ankle-NAD-presents to triage in w/c

## 2016-04-02 NOTE — ED Notes (Signed)
Dr. Horton, and family x2 at [facility].  

## 2016-04-02 NOTE — ED Provider Notes (Addendum)
[facility]   CSN: [id number] Arrival date & time: 04/02/16  2119  First Provider Contact: 04/02/2016 11:19 PM By signing my name below, I, Bridgette Habermann, attest that this documentation has been prepared under the direction and in the presence of Shon Baton, MD. Electronically Signed: Bridgette Habermann, ED Scribe. 04/02/16. 11:24 PM.  History   Chief Complaint Chief Complaint  Patient presents with  . Fall    HPI Comments: Sherry Clark is a 45 y.o. female who presents to the Emergency Department complaining of sudden onset, constant, 10/10 left ankle pain s/p mechanical fall around 8:30 pm today. Pt rolled her ankle laterally. No LOC. Pt presents to ED in a wheel chair. Pt also notes some numbness in left toes but states it could be because of the ice she applied. Pt has not tried OTC medication PTA. Pt denies any head injury or additional injury. Pt denies paresthesias or any other associated symptoms.   The history is provided by the patient. No language interpreter was used.    Past Medical History:  Diagnosis Date  . Hypertension     There are no active problems to display for this patient.   Past Surgical History:  Procedure Laterality Date  . KNEE SURGERY     Left Knee  . TUBAL LIGATION      OB History    No data available       Home Medications    Prior to Admission medications   Medication Sig Start Date End Date Taking? Authorizing Provider  albuterol (PROVENTIL HFA;VENTOLIN HFA) 108 (90 Base) MCG/ACT inhaler Inhale 1-2 puffs into the lungs every 6 (six) hours as needed for wheezing or shortness of breath. 11/26/15   April Palumbo, MD  azithromycin (ZITHROMAX) 250 MG tablet Take 1 tablet (250 mg total) by mouth daily. Take first 2 tablets together, then 1 every day until finished. 11/17/15   April Palumbo, MD  benzonatate (TESSALON) 100 MG capsule Take 1 capsule (100 mg total) by mouth every 8 (eight) hours. 11/17/15   April Palumbo, MD    dicyclomine (BENTYL) 20 MG tablet Take 1 tablet (20 mg total) by mouth 2 (two) times daily. 10/31/15   April Palumbo, MD  ferrous sulfate 325 (65 FE) MG tablet Take 1 tablet (325 mg total) by mouth 3 (three) times daily with meals. 05/01/15   Nicole Pisciotta, PA-C  fluticasone (FLONASE) 50 MCG/ACT nasal spray Place 2 sprays into both nostrils daily. 11/26/15   April Palumbo, MD  Guaifenesin 1200 MG TB12 Take 1 tablet (1,200 mg total) by mouth 2 (two) times daily. 11/17/15   April Palumbo, MD  ibuprofen (ADVIL,MOTRIN) 600 MG tablet Take 1 tablet (600 mg total) by mouth every 6 (six) hours as needed. 04/02/16   Shon Baton, MD  ondansetron (ZOFRAN ODT) 8 MG disintegrating tablet 8mg  ODT q8 hours prn nausea 10/31/15   April Palumbo, MD  pantoprazole (PROTONIX) 20 MG tablet Take 1 tablet (20 mg total) by mouth daily. 08/14/15   Elpidio Anis, PA-C  promethazine (PHENERGAN) 25 MG tablet Take 1 tablet (25 mg total) by mouth every 6 (six) hours as needed for nausea or vomiting. 05/01/15   Joni Reining Pisciotta, PA-C  sucralfate (CARAFATE) 1 G tablet Take 1 tablet (1 g total) by mouth 4 (four) times daily -  with meals and at bedtime. 08/14/15   Elpidio Anis, PA-C    Family History No family history on file.  Social History Social History  Substance Use Topics  . Smoking status: Current Every Day Smoker    Packs/day: 0.50    Years: 10.00    Types: Cigarettes  . Smokeless tobacco: Never Used  . Alcohol use No     Allergies   Review of patient's allergies indicates no known allergies.   Review of Systems Review of Systems  Constitutional: Negative for fever.  Musculoskeletal: Positive for arthralgias.  Neurological: Positive for numbness. Negative for syncope.  All other systems reviewed and are negative.  Physical Exam Updated Vital Signs BP 127/84 (BP Location: Right Arm)   Pulse 70   Temp 98.3 F (36.8 C) (Oral)   Resp 18   Ht  (1.6 m)   Wt 173 lb (78.5 kg)   LMP 03/15/2016    SpO2 100%   BMI 30.65 kg/m   Physical Exam  Constitutional: She is oriented to person, place, and time. She appears well-developed and well-nourished.  HENT:  Head: Normocephalic and atraumatic.  Cardiovascular: Normal rate, regular rhythm and normal heart sounds.   Pulmonary/Chest: Effort normal and breath sounds normal. No respiratory distress.  Musculoskeletal: She exhibits tenderness. She exhibits no edema or deformity.  Limited range of motion of the left ankle secondary to pain, lateral malleolus tenderness without obvious deformity, no midfoot tenderness, 2+ DP pulse  Neurological: She is alert and oriented to person, place, and time.  Skin: Skin is warm and dry.  Psychiatric: She has a normal mood and affect.  Nursing note and vitals reviewed.    ED Treatments / Results  DIAGNOSTIC STUDIES: Oxygen Saturation is 100% on RA, normal by my interpretation.    COORDINATION OF CARE: 11:19 PM Discussed treatment plan with pt at bedside which includes ace bandage and pt agreed to plan.  Labs (all labs ordered are listed, but only abnormal results are displayed) Labs Reviewed - No data to display  EKG  EKG Interpretation None       Radiology Dg Ankle Complete Left  Result Date: 04/02/2016 CLINICAL DATA:  45 year old female with twisting of the left ankle. EXAM: LEFT ANKLE COMPLETE - 3+ VIEW COMPARISON:  None. FINDINGS: There is no evidence of fracture, dislocation, or joint effusion. There is no evidence of arthropathy or other focal bone abnormality. Soft tissues are unremarkable. IMPRESSION: Negative. Electronically Signed   By: Elgie Collard M.D.   On: 04/02/2016 22:04   Procedures Procedures (including critical care time)  Medications Ordered in ED Medications  HYDROcodone-acetaminophen (NORCO/VICODIN) 5-325 MG per tablet 1 tablet (not administered)     Initial Impression / Assessment and Plan / ED Course  I have reviewed the triage vital signs and the  nursing notes.  Pertinent labs & imaging results that were available during my care of the patient were reviewed by me and considered in my medical decision making (see chart for details).  Clinical Course    Patient presents with fall. Left ankle injury. X-rays negative for fracture. Likely ankle sprain. Recommended RICE therapy. Crutches for comfort for the next 1-2 days.  Final Clinical Impressions(s) / ED Diagnoses   Final diagnoses:  Ankle sprain, left, initial encounter    New Prescriptions Current Discharge Medication List     I personally performed the services described in this documentation, which was scribed in my presence. The recorded information has been reviewed and is accurate.     Shon Baton, MD 04/02/16 3557    Shon Baton, MD 04/03/16 [phone]

## 2016-04-03 NOTE — ED Notes (Signed)
Pt "ready to go", ACE wrap in place, crutches in hand, given Rx x1, denies questions or needs, VSS, steady gait on crutches, out with family x2.

## 2016-04-09 ENCOUNTER — Encounter (HOSPITAL_BASED_OUTPATIENT_CLINIC_OR_DEPARTMENT_OTHER): Payer: Self-pay | Admitting: Emergency Medicine

## 2016-04-09 ENCOUNTER — Emergency Department (HOSPITAL_BASED_OUTPATIENT_CLINIC_OR_DEPARTMENT_OTHER)
Admission: EM | Admit: 2016-04-09 | Discharge: 2016-04-09 | Disposition: A | Discharge status: Home / Self Care | Payer: Medicaid Other | Attending: Emergency Medicine | Admitting: Emergency Medicine

## 2016-04-09 DIAGNOSIS — K2970 Gastritis, unspecified, without bleeding: Secondary | ICD-10-CM | POA: Insufficient documentation

## 2016-04-09 DIAGNOSIS — F17210 Nicotine dependence, cigarettes, uncomplicated: Secondary | ICD-10-CM | POA: Diagnosis not present

## 2016-04-09 DIAGNOSIS — I10 Essential (primary) hypertension: Secondary | ICD-10-CM | POA: Diagnosis not present

## 2016-04-09 DIAGNOSIS — R109 Unspecified abdominal pain: Secondary | ICD-10-CM | POA: Diagnosis present

## 2016-04-09 MED ORDER — OMEPRAZOLE 40 MG PO CPDR: 40.0000 mg | DELAYED_RELEASE_CAPSULE | Freq: Every day | ORAL | 0 refills | Status: AC | PRN

## 2016-04-09 MED ORDER — ONDANSETRON 4 MG PO TBDP
4.0000 mg | ORAL_TABLET | Freq: Once | ORAL | Status: AC
  Administered 2016-04-09: 4 mg via ORAL
  Filled 2016-04-09: qty 1

## 2016-04-09 MED ORDER — PANTOPRAZOLE SODIUM 40 MG PO TBEC
40.0000 mg | DELAYED_RELEASE_TABLET | Freq: Once | ORAL | Status: AC
  Administered 2016-04-09: 40 mg via ORAL
  Filled 2016-04-09: qty 1

## 2016-04-09 MED ORDER — GI COCKTAIL ~~LOC~~
30.0000 mL | Freq: Once | ORAL | Status: AC
  Administered 2016-04-09: 30 mL via ORAL
  Filled 2016-04-09: qty 30

## 2016-04-09 MED ORDER — ONDANSETRON 4 MG PO TBDP: 4.0000 mg | ORAL_TABLET | Freq: Three times a day (TID) | ORAL | 0 refills | Status: DC | PRN

## 2016-04-09 NOTE — ED Notes (Signed)
Pt made aware to return if symptoms worsen or if any life threatening symptoms occur.   

## 2016-04-09 NOTE — ED Provider Notes (Signed)
TIME SEEN: 3:30 AM  CHIEF COMPLAINT: Abdominal burning, nausea  HPI: Pt is a 45 y.o. female with history of hypertension, chronic back pain who presents emergency department with abdominal pain. States tonight she was having her normal back pain and she took a motorbike. States that it was not working as well as it normally does and around 1 AM take Advil PM. States she looked on the bottle of Mobitz noticed it was expired so she called the on-call nurse who told her that she should not take expired medications and that they could be "too strong" or "too weak". She states she started looking on the Internet and got very concerned and then started having diffuse abdominal burning, nausea. States she was stared to go to sleep and that is why she came to the hospital. No other ingestions. No fevers, vomiting, bloody stools or melena. No history of abdominal surgery. Does have a history of GERD.  ROS: See HPI Constitutional: no fever  Eyes: no drainage  ENT: no runny nose   Cardiovascular:  no chest pain  Resp: no SOB  GI: no vomiting GU: no dysuria Integumentary: no rash  Allergy: no hives  Musculoskeletal: no leg swelling  Neurological: no slurred speech ROS otherwise negative  PAST MEDICAL HISTORY/PAST SURGICAL HISTORY:  Past Medical History:  Diagnosis Date  . Hypertension     MEDICATIONS:  Prior to Admission medications   Medication Sig Start Date End Date Taking? Authorizing Provider  albuterol (PROVENTIL HFA;VENTOLIN HFA) 108 (90 Base) MCG/ACT inhaler Inhale 1-2 puffs into the lungs every 6 (six) hours as needed for wheezing or shortness of breath. 11/26/15   April Palumbo, MD  azithromycin (ZITHROMAX) 250 MG tablet Take 1 tablet (250 mg total) by mouth daily. Take first 2 tablets together, then 1 every day until finished. 11/17/15   April Palumbo, MD  benzonatate (TESSALON) 100 MG capsule Take 1 capsule (100 mg total) by mouth every 8 (eight) hours. 11/17/15   April Palumbo, MD   dicyclomine (BENTYL) 20 MG tablet Take 1 tablet (20 mg total) by mouth 2 (two) times daily. 10/31/15   April Palumbo, MD  ferrous sulfate 325 (65 FE) MG tablet Take 1 tablet (325 mg total) by mouth 3 (three) times daily with meals. 05/01/15   Nicole Pisciotta, PA-C  fluticasone (FLONASE) 50 MCG/ACT nasal spray Place 2 sprays into both nostrils daily. 11/26/15   April Palumbo, MD  Guaifenesin 1200 MG TB12 Take 1 tablet (1,200 mg total) by mouth 2 (two) times daily. 11/17/15   April Palumbo, MD  ibuprofen (ADVIL,MOTRIN) 600 MG tablet Take 1 tablet (600 mg total) by mouth every 6 (six) hours as needed. 04/02/16   Shon Baton, MD  ondansetron (ZOFRAN ODT) 8 MG disintegrating tablet 8mg  ODT q8 hours prn nausea 10/31/15   April Palumbo, MD  pantoprazole (PROTONIX) 20 MG tablet Take 1 tablet (20 mg total) by mouth daily. 08/14/15   Elpidio Anis, PA-C  promethazine (PHENERGAN) 25 MG tablet Take 1 tablet (25 mg total) by mouth every 6 (six) hours as needed for nausea or vomiting. 05/01/15   Joni Reining Pisciotta, PA-C  sucralfate (CARAFATE) 1 G tablet Take 1 tablet (1 g total) by mouth 4 (four) times daily -  with meals and at bedtime. 08/14/15   Elpidio Anis, PA-C    ALLERGIES:  No Known Allergies  SOCIAL HISTORY:  Social History  Substance Use Topics  . Smoking status: Current Every Day Smoker    Packs/day: 0.50  Years: 10.00    Types: Cigarettes  . Smokeless tobacco: Never Used  . Alcohol use No    FAMILY HISTORY: No family history on file.  EXAM: BP 159/89 (BP Location: Right Arm)   Pulse (!) 59   Temp 98.3 F (36.8 C) (Oral)   Resp 16   Ht  (1.6 m)   Wt 170 lb (77.1 kg)   LMP 03/15/2016   SpO2 100%   BMI 30.11 kg/m  CONSTITUTIONAL: Alert and oriented and responds appropriately to questions. Well-appearing; well-nourished HEAD: Normocephalic EYES: Conjunctivae clear, PERRL ENT: normal nose; no rhinorrhea; moist mucous membranes NECK: Supple, no meningismus, no LAD  CARD:  RRR; S1 and S2 appreciated; no murmurs, no clicks, no rubs, no gallops RESP: Normal chest excursion without splinting or tachypnea; breath sounds clear and equal bilaterally; no wheezes, no rhonchi, no rales, no hypoxia or respiratory distress, speaking full sentences ABD/GI: Normal bowel sounds; non-distended; soft, non-tender, no rebound, no guarding, no peritoneal signs BACK:  The back appears normal and is non-tender to palpation, there is no CVA tenderness EXT: Normal ROM in all joints; non-tender to palpation; no edema; normal capillary refill; no cyanosis, no calf tenderness or swelling    SKIN: Normal color for age and race; warm; no rash NEURO: Moves all extremities equally, sensation to light touch intact diffusely, cranial nerves II through XII intact PSYCH: The patient's mood and manner are appropriate. Grooming and personal hygiene are appropriate.  MEDICAL DECISION MAKING: Patient here with abdominal burning after taking NSAIDs. Suspect gastritis, GERD. Her abdominal exam is completely benign. I also suspect there is a component of anxiety present. Doubt colitis, appendicitis, cholecystitis, pancreatitis, bowel obstruction. I do not feel this time she needs emergent imaging. No vomiting, bloody stools, melena. We'll give GI cocktail, Protonix, Zofran and reassess.  ED PROGRESS: 4:25 AM  Pt reports that her pain is now gone. I recommend that she avoid NSAIDs for the next several days and eat a bland diet. Recommend she avoid alcohol. Discussed her if she begins using myopic again that she should take it with food and not take any other NSAIDs. Discussed with her she continues to have abdominal pain that she should follow-up with her primary care physician. Discussed with she has severe abdominal pain, bloody stools, melena, vomiting that she return to the hospital.   At this time, I do not feel there is any life-threatening condition present. I have reviewed and discussed all results (EKG,  imaging, lab, urine as appropriate), exam findings with patient/family. I have reviewed nursing notes and appropriate previous records.  I feel the patient is safe to be discharged home without further emergent workup and can continue workup as an outpatient. Discussed usual and customary return precautions. Patient/family verbalize understanding and are comfortable with this plan.  Outpatient follow-up has been provided. All questions have been answered.      Layla Maw Ward, DO 04/09/16 [phone]

## 2016-04-09 NOTE — ED Triage Notes (Signed)
Pt reports taking mobic around 2200 last night and because the mobic was not helping back pain, pt took advil pm.  Pt states since she took the advil pm her stomach has been burning and she has been having nausea.  Denies v/d/fever/urinary symptoms.  Pt alert and oriented.

## 2016-04-09 NOTE — Discharge Instructions (Signed)
Please do not take meloxicam (Mobic) with other NSAIDs such as ibuprofen (Motrin, Advil), aspirin, naproxen (Aleve), Goody powders as this may upset your stomach. When you take meloxicam, I recommend you do so on a full stomach. If it continues to cause abdominal pain, I recommend you discontinue this medication and follow-up with your primary care physician for further pain management.   To find a primary care or specialty doctor please call [phone] or [phone] to access "[facility]."  You may also go on the [facility] website at [email or web address]  There are also multiple [facility] practices throughout the [place] that are frequently accepting new patients. You may find a clinic that is close to your home and contact them.  [facility] -  [address] [place] [address] [phone]  [facility] in [place] (also locations in [place] and [place]) -  [address] [place] [address] [phone]  [facility] -  [address] [place] [address] [phone]

## 2016-04-16 ENCOUNTER — Emergency Department (HOSPITAL_BASED_OUTPATIENT_CLINIC_OR_DEPARTMENT_OTHER)
Admission: EM | Admit: 2016-04-16 | Discharge: 2016-04-16 | Disposition: A | Discharge status: Home / Self Care | Payer: Medicaid Other | Attending: Emergency Medicine | Admitting: Emergency Medicine

## 2016-04-16 ENCOUNTER — Encounter (HOSPITAL_BASED_OUTPATIENT_CLINIC_OR_DEPARTMENT_OTHER): Payer: Self-pay | Admitting: Emergency Medicine

## 2016-04-16 DIAGNOSIS — A084 Viral intestinal infection, unspecified: Secondary | ICD-10-CM | POA: Insufficient documentation

## 2016-04-16 DIAGNOSIS — Z79899 Other long term (current) drug therapy: Secondary | ICD-10-CM | POA: Insufficient documentation

## 2016-04-16 DIAGNOSIS — F17210 Nicotine dependence, cigarettes, uncomplicated: Secondary | ICD-10-CM | POA: Insufficient documentation

## 2016-04-16 DIAGNOSIS — I10 Essential (primary) hypertension: Secondary | ICD-10-CM | POA: Diagnosis not present

## 2016-04-16 DIAGNOSIS — R509 Fever, unspecified: Secondary | ICD-10-CM | POA: Diagnosis present

## 2016-04-16 DIAGNOSIS — K529 Noninfective gastroenteritis and colitis, unspecified: Secondary | ICD-10-CM

## 2016-04-16 LAB — COMPREHENSIVE METABOLIC PANEL
ALBUMIN: 4.4 g/dL (ref 3.5–5.0)
ALK PHOS: 47 U/L (ref 38–126)
ALT: 13 U/L — AB (ref 14–54)
AST: 14 U/L — ABNORMAL LOW (ref 15–41)
Anion gap: 6 (ref 5–15)
BUN: 11 mg/dL (ref 6–20)
CALCIUM: 9.2 mg/dL (ref 8.9–10.3)
CO2: 26 mmol/L (ref 22–32)
CREATININE: 0.71 mg/dL (ref 0.44–1.00)
Chloride: 107 mmol/L (ref 101–111)
GFR calc non Af Amer: >60 mL/min (ref >60)
GLUCOSE: 92 mg/dL (ref 65–99)
Potassium: 3.8 mmol/L (ref 3.5–5.1)
Sodium: 139 mmol/L (ref 135–145)
Total Bilirubin: 0.4 mg/dL (ref 0.3–1.2)
Total Protein: 8 g/dL (ref 6.5–8.1)

## 2016-04-16 LAB — URINALYSIS, ROUTINE W REFLEX MICROSCOPIC
BILIRUBIN URINE: NEGATIVE
GLUCOSE, UA: NEGATIVE mg/dL
KETONES UR: NEGATIVE mg/dL
Nitrite: NEGATIVE
PH: 6.5 (ref 5.0–8.0)
Protein, ur: NEGATIVE mg/dL
Specific Gravity, Urine: 1.002 — ABNORMAL LOW (ref 1.005–1.030)

## 2016-04-16 LAB — CBC WITH DIFFERENTIAL/PLATELET
BASOS PCT: 0 %
Basophils Absolute: 0 10*3/uL (ref 0.0–0.1)
EOS ABS: 0.1 10*3/uL (ref 0.0–0.7)
EOS PCT: 2 %
HCT: 37 % (ref 36.0–46.0)
Hemoglobin: 12.4 g/dL (ref 12.0–15.0)
LYMPHS ABS: 2.2 10*3/uL (ref 0.7–4.0)
Lymphocytes Relative: 45 %
MCH: 28.4 pg (ref 26.0–34.0)
MCHC: 33.5 g/dL (ref 30.0–36.0)
MCV: 84.9 fL (ref 78.0–100.0)
Monocytes Absolute: 0.5 10*3/uL (ref 0.1–1.0)
Monocytes Relative: 10 %
Neutro Abs: 2.1 10*3/uL (ref 1.7–7.7)
Neutrophils Relative %: 43 %
PLATELETS: 397 10*3/uL (ref 150–400)
RBC: 4.36 MIL/uL (ref 3.87–5.11)
RDW: 13.6 % (ref 11.5–15.5)
WBC: 4.8 10*3/uL (ref 4.0–10.5)

## 2016-04-16 LAB — LIPASE, BLOOD: LIPASE: 28 U/L (ref 11–51)

## 2016-04-16 LAB — URINE MICROSCOPIC-ADD ON

## 2016-04-16 LAB — PREGNANCY, URINE: PREG TEST UR: NEGATIVE

## 2016-04-16 MED ORDER — HYDROCHLOROTHIAZIDE 25 MG PO TABS: 25.0000 mg | ORAL_TABLET | Freq: Every day | ORAL | 1 refills | Status: AC

## 2016-04-16 MED ORDER — SODIUM CHLORIDE 0.9 % IV BOLUS (SEPSIS)
1000.0000 mL | Freq: Once | INTRAVENOUS | Status: AC
  Administered 2016-04-16: 1000 mL via INTRAVENOUS

## 2016-04-16 MED ORDER — ONDANSETRON HCL 4 MG/2ML IJ SOLN
4.0000 mg | Freq: Once | INTRAMUSCULAR | Status: AC
  Administered 2016-04-16: 4 mg via INTRAVENOUS
  Filled 2016-04-16: qty 2

## 2016-04-16 MED ORDER — KETOROLAC TROMETHAMINE 30 MG/ML IJ SOLN
30.0000 mg | Freq: Once | INTRAMUSCULAR | Status: AC
  Administered 2016-04-16: 30 mg via INTRAVENOUS
  Filled 2016-04-16: qty 1

## 2016-04-16 MED FILL — HYDROCHLOROTHIAZIDE 25 MG T: 25 | 30 days supply | Qty: 30 | Fill #0

## 2016-04-16 NOTE — ED Provider Notes (Signed)
[facility] Provider Note   CSN: [id number] Arrival date & time: 04/16/16  1018  First Provider Contact:  None       History   Chief Complaint Chief Complaint  Patient presents with  . Emesis  . Fever    HPI Sherry Clark is a 45 y.o. female.  Patient is a 45 year old female with history of hypertension. She presents for evaluation of nausea, vomiting, and diarrhea with associated fever. This started yesterday evening. All has been nonbloody and nonbilious. She denies any ill contacts. She denies any aggravating or alleviating factors.   The history is provided by the patient.    Past Medical History:  Diagnosis Date  . Hypertension     There are no active problems to display for this patient.   Past Surgical History:  Procedure Laterality Date  . KNEE SURGERY     Left Knee  . TUBAL LIGATION      OB History    No data available       Home Medications    Prior to Admission medications   Medication Sig Start Date End Date Taking? Authorizing Provider  hydrochlorothiazide (HYDRODIURIL) 25 MG tablet Take 30 mg by mouth daily.   Yes Historical Provider, MD  albuterol (PROVENTIL HFA;VENTOLIN HFA) 108 (90 Base) MCG/ACT inhaler Inhale 1-2 puffs into the lungs every 6 (six) hours as needed for wheezing or shortness of breath. 11/26/15   April Palumbo, MD  fluticasone [facility](FLONASE) 50 MCG/ACT nasal spray Place 2 sprays into both nostrils daily. 11/26/15   April Palumbo, MD  omeprazole (PRILOSEC) 40 MG capsule Take 1 capsule (40 mg total) by mouth daily as needed (acid reflux, burning abdominal pain). 04/09/16   Kristen N Ward, DO  ondansetron (ZOFRAN ODT) 4 MG disintegrating tablet Take 1 tablet (4 mg total) by mouth every 8 (eight) hours as needed for nausea or vomiting. 04/09/16   Layla MawKristen N Ward, DO    Family History No family history on file.  Social History Social History  Substance Use Topics  . Smoking status: Current Every Day Smoker   Packs/day: 0.25    Years: 10.00    Types: Cigarettes  . Smokeless tobacco: Never Used  . Alcohol use No     Allergies   Review of patient's allergies indicates no known allergies.   Review of Systems Review of Systems  All other systems reviewed and are negative.    Physical Exam Updated Vital Signs BP (!) 149/101 (BP Location: Left Arm)   Pulse 67   Temp 98.1 F (36.7 C) (Oral)   Resp 18   Ht 5\' 3"  (1.6 m)   Wt 173 lb (78.5 kg)   LMP 04/16/2016   SpO2 100%   BMI 30.65 kg/m   Physical Exam  Constitutional: She is oriented to person, place, and time. She appears well-developed and well-nourished. No distress.  HENT:  Head: Normocephalic and atraumatic.  Neck: Normal range of motion. Neck supple.  Cardiovascular: Normal rate and regular rhythm.  Exam reveals no gallop and no friction rub.   No murmur heard. Pulmonary/Chest: Effort normal and breath sounds normal. No respiratory distress. She has no wheezes.  Abdominal: Soft. Bowel sounds are normal. She exhibits no distension. There is tenderness. There is no rebound and no guarding.  There is mild generalized abdominal tenderness.  Musculoskeletal: Normal range of motion.  Neurological: She is alert and oriented to person, place, and time.  Skin: Skin is warm and dry. She is not  diaphoretic.  Nursing note and vitals reviewed.    ED Treatments / Results  Labs (all labs ordered are listed, but only abnormal results are displayed) Labs Reviewed  LIPASE, BLOOD  COMPREHENSIVE METABOLIC PANEL  URINALYSIS, ROUTINE W REFLEX MICROSCOPIC (NOT AT [facility])  CBC WITH DIFFERENTIAL/PLATELET    EKG  EKG Interpretation None       Radiology No results found.  Procedures Procedures (including critical care time)  Medications Ordered in ED Medications  sodium chloride 0.9 % bolus 1,000 mL (not administered)  ondansetron (ZOFRAN) injection 4 mg (not administered)  ketorolac (TORADOL) 30 MG/ML injection 30 mg (not  administered)     Initial Impression / Assessment and Plan / ED Course  I have reviewed the triage vital signs and the nursing notes.  Pertinent labs & imaging results that were available during my care of the patient were reviewed by me and considered in my medical decision making (see chart for details).  Clinical Course      Final Clinical Impressions(s) / ED Diagnoses   Final diagnoses:  None   Symptoms most consistent with a viral gastroenteritis. She is feeling better after fluids and medications given in the ER. She will be discharged with instructions to return as needed.  New Prescriptions New Prescriptions   No medications on file     Geoffery Lyons, MD 04/16/16 [phone]

## 2016-04-16 NOTE — ED Notes (Signed)
MD at bedside. 

## 2016-04-16 NOTE — ED Triage Notes (Signed)
Pt presents with emesis x2 and diarrhea (4-5 episodes) with a fever of 101 since 0400 this am. States she had her BP checked at work and it was elevated. Reports taking tylenol for fever but is out of her BP medication due to PCP being switched. States she is having to be seen as a new patient and there are no appointments until the end of September. Denies being around anyone sick. Vitals stable. A/O at triage NAD

## 2016-04-16 NOTE — Discharge Instructions (Signed)
Return to the emergency department if symptoms significantly worsen or change. 

## 2017-09-27 IMAGING — CR DG ANKLE COMPLETE 3+V*L*
3 series · 3 of 3 positions shown · non-contrast
Comparison: None.

CLINICAL DATA: 44-year-old female with twisting of the left ankle.

EXAM:
LEFT ANKLE COMPLETE - 3+ VIEW

[t ankle joint ap left]
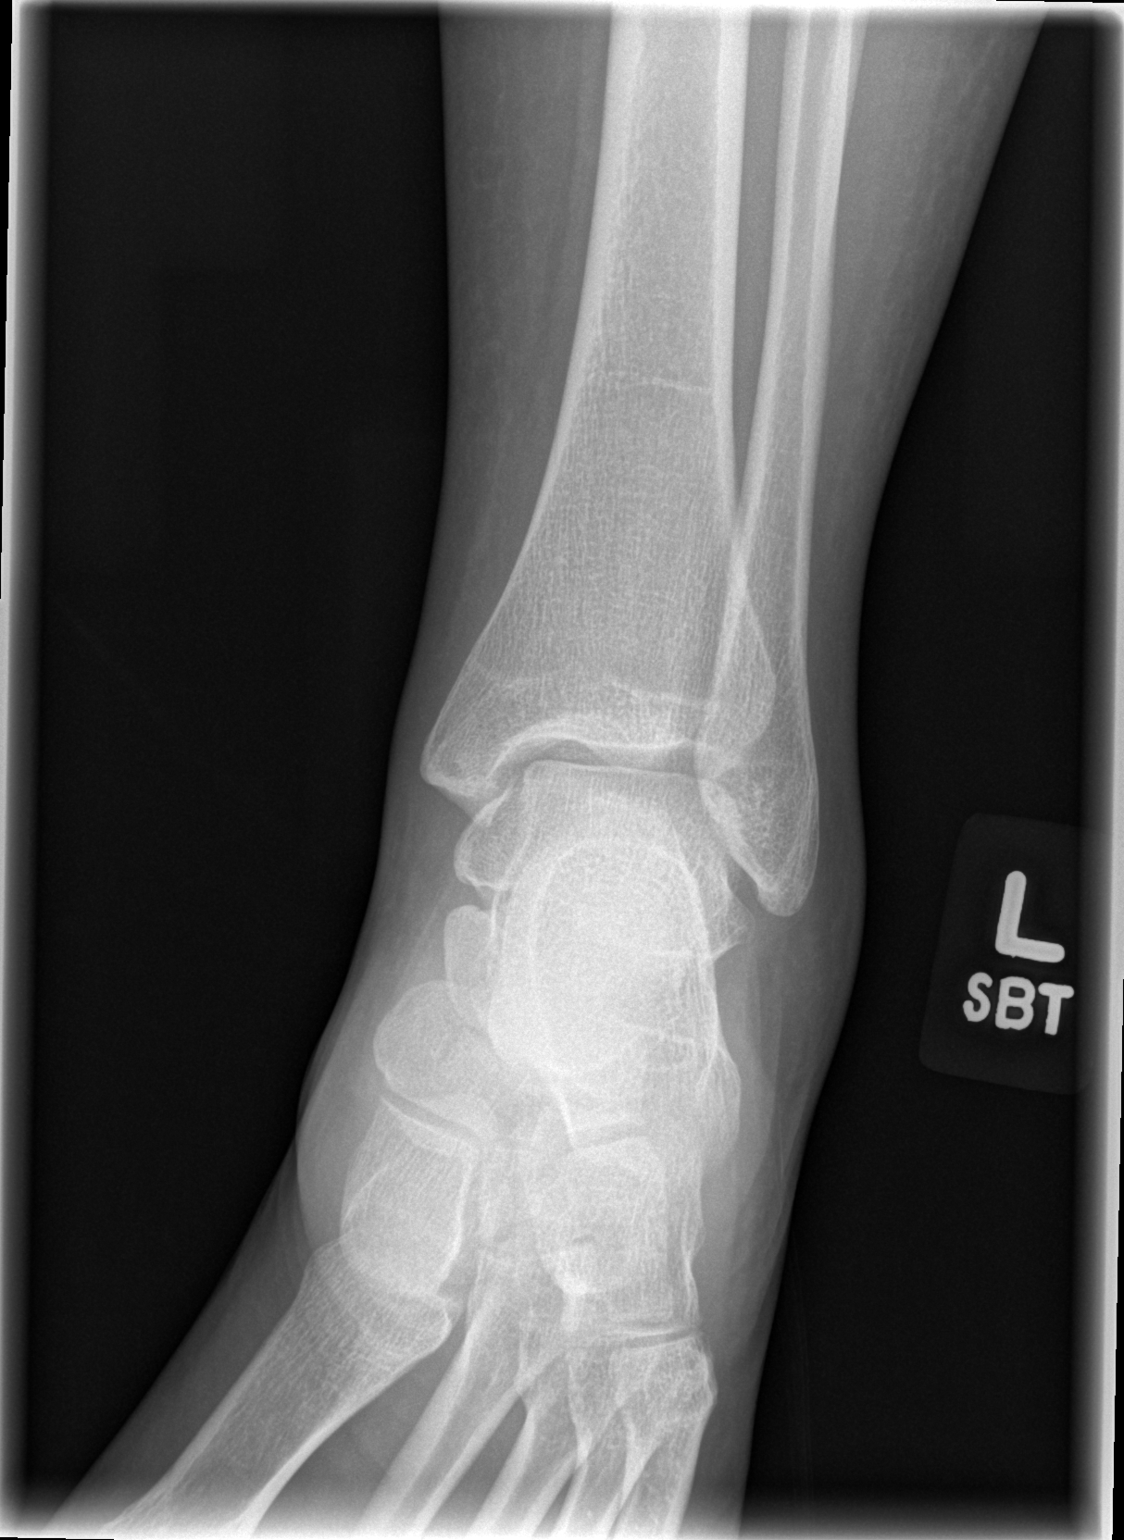

[t ankle joint oblique left]
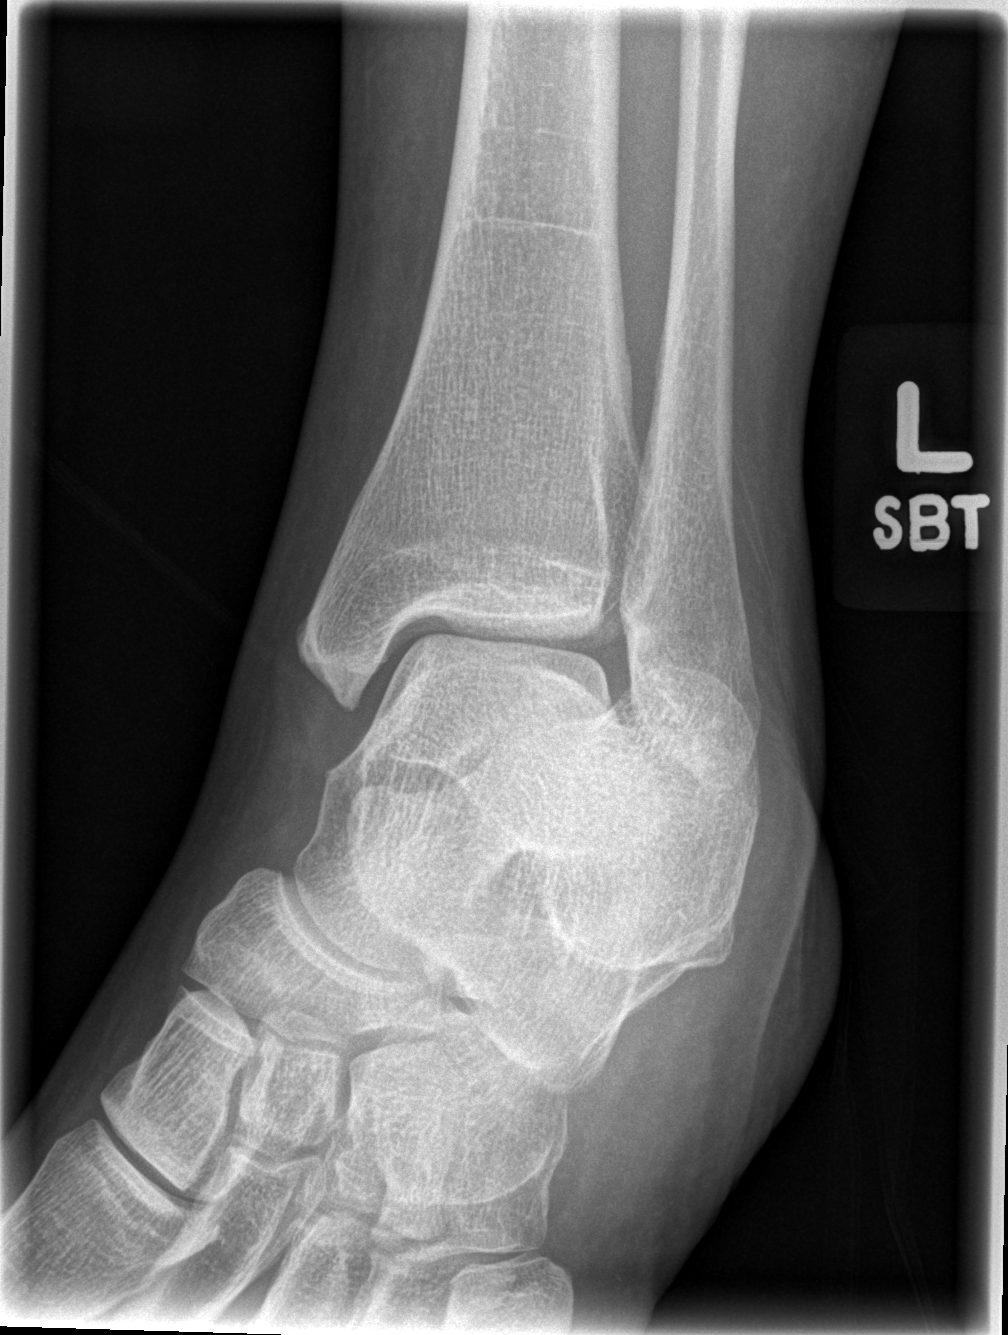

[t ankle joint lat left]
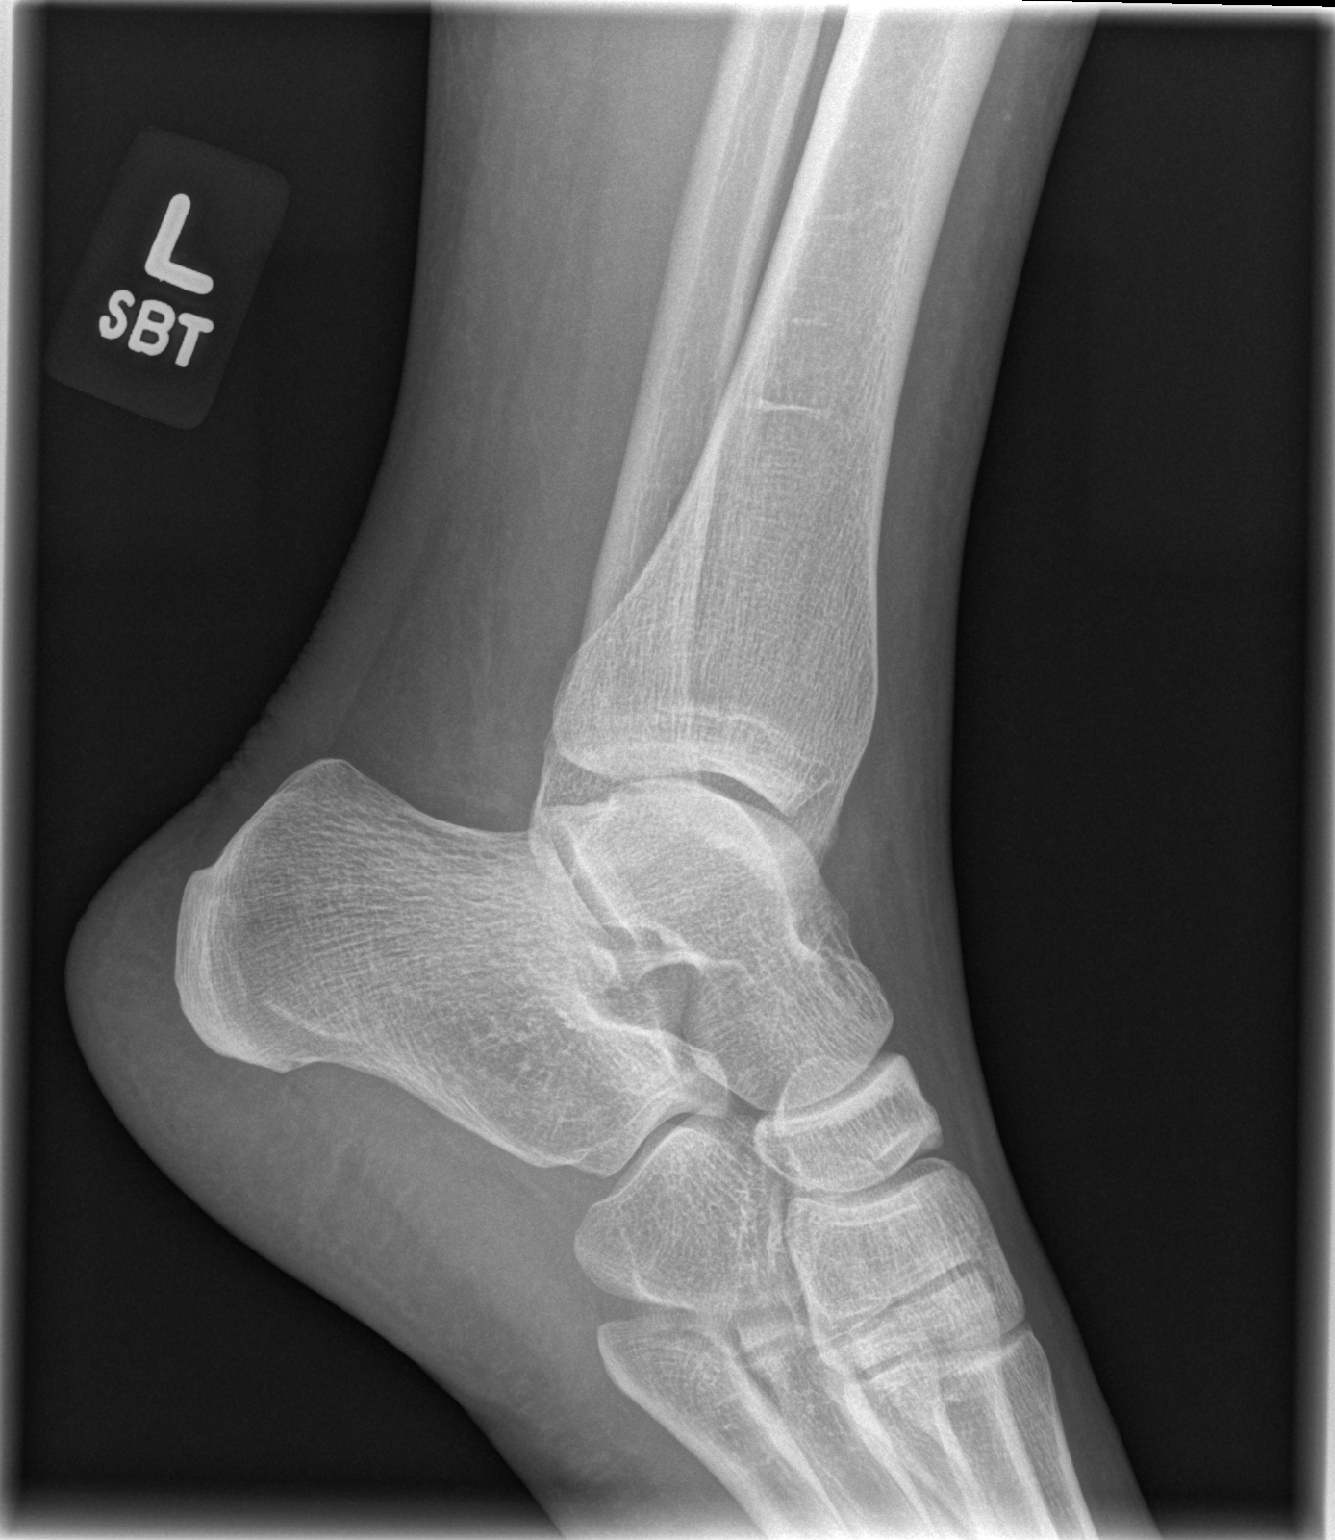

[3 of 3 positions shown; findings below may reference images not displayed]

FINDINGS: There is no evidence of fracture, dislocation, or joint effusion.
There is no evidence of arthropathy or other focal bone abnormality.
Soft tissues are unremarkable.
IMPRESSION: Negative.

## 2017-10-04 ENCOUNTER — Encounter (HOSPITAL_BASED_OUTPATIENT_CLINIC_OR_DEPARTMENT_OTHER): Payer: Self-pay | Admitting: *Deleted

## 2017-10-04 DIAGNOSIS — R1110 Vomiting, unspecified: Secondary | ICD-10-CM | POA: Insufficient documentation

## 2017-10-04 DIAGNOSIS — F17210 Nicotine dependence, cigarettes, uncomplicated: Secondary | ICD-10-CM | POA: Insufficient documentation

## 2017-10-04 DIAGNOSIS — R197 Diarrhea, unspecified: Secondary | ICD-10-CM | POA: Insufficient documentation

## 2017-10-04 DIAGNOSIS — M791 Myalgia, unspecified site: Secondary | ICD-10-CM | POA: Insufficient documentation

## 2017-10-04 DIAGNOSIS — R69 Illness, unspecified: Secondary | ICD-10-CM | POA: Insufficient documentation

## 2017-10-04 DIAGNOSIS — I10 Essential (primary) hypertension: Secondary | ICD-10-CM | POA: Insufficient documentation

## 2017-10-04 DIAGNOSIS — Z79899 Other long term (current) drug therapy: Secondary | ICD-10-CM | POA: Insufficient documentation

## 2017-10-04 DIAGNOSIS — R05 Cough: Secondary | ICD-10-CM | POA: Insufficient documentation

## 2017-10-04 NOTE — ED Triage Notes (Signed)
Pt states reports fevers, coughing, sneezing, body aches and nausea starting this morning. Last tylenol around 2pm today.

## 2017-10-05 ENCOUNTER — Emergency Department (HOSPITAL_BASED_OUTPATIENT_CLINIC_OR_DEPARTMENT_OTHER)
Admission: EM | Admit: 2017-10-05 | Discharge: 2017-10-05 | Disposition: A | Discharge status: Home / Self Care | Payer: Self-pay | Attending: Emergency Medicine | Admitting: Emergency Medicine

## 2017-10-05 DIAGNOSIS — R69 Illness, unspecified: Secondary | ICD-10-CM

## 2017-10-05 DIAGNOSIS — J111 Influenza due to unidentified influenza virus with other respiratory manifestations: Secondary | ICD-10-CM

## 2017-10-05 MED ORDER — ONDANSETRON 4 MG PO TBDP
4.0000 mg | ORAL_TABLET | Freq: Once | ORAL | Status: AC
Start: 2017-10-05 — End: 2017-10-05
  Administered 2017-10-05: 4 mg via ORAL
  Filled 2017-10-05: qty 1

## 2017-10-05 MED ORDER — ONDANSETRON 4 MG PO TBDP: 4.0000 mg | ORAL_TABLET | Freq: Three times a day (TID) | ORAL | 0 refills | Status: AC | PRN

## 2017-10-05 NOTE — ED Provider Notes (Signed)
[facility] EMERGENCY DEPARTMENT Provider Note   CSN: [id number] Arrival date & time: 10/04/17  2348     History   Chief Complaint Chief Complaint  Patient presents with  . URI    HPI Sherry Clark is a 47 y.o. female.  HPI  47 year old female presents with feeling ill since 2 AM on 1/25.  She states she has been having diarrhea, vomiting, diffuse body aches, subjective fever, and cough.  Nonproductive cough.  No shortness of breath or chest pain.  She denies any abdominal pain or any headache or sore throat.  She has taken Tylenol.  She has had 3 episodes of emesis this morning and has been persistently nauseated since.  Multiple episodes of diarrhea.  She has not received a flu vaccine.  She denies any significant past medical history including diabetes, hypertension, or asthma.  She does smoke.  Past Medical History:  Diagnosis Date  . Hypertension     There are no active problems to display for this patient.   Past Surgical History:  Procedure Laterality Date  . KNEE SURGERY     Left Knee  . TUBAL LIGATION      OB History    No data available       Home Medications    Prior to Admission medications   Medication Sig Start Date End Date Taking? Authorizing Provider  albuterol (PROVENTIL HFA;VENTOLIN HFA) 108 (90 Base) MCG/ACT inhaler Inhale 1-2 puffs into the lungs every 6 (six) hours as needed for wheezing or shortness of breath. 11/26/15   Palumbo, April, MD  fluticasone (FLONASE) 50 MCG/ACT nasal spray Place 2 sprays into both nostrils daily. 11/26/15   Palumbo, April, MD  hydrochlorothiazide (HYDRODIURIL) 25 MG tablet Take 1 tablet (25 mg total) by mouth daily. 04/16/16   Geoffery Lyons, MD  omeprazole (PRILOSEC) 40 MG capsule Take 1 capsule (40 mg total) by mouth daily as needed (acid reflux, burning abdominal pain). 04/09/16   Ward, Layla Maw, DO  ondansetron (ZOFRAN ODT) 4 MG disintegrating tablet Take 1 tablet (4 mg total) by mouth every 8 (eight)  hours as needed for nausea or vomiting. 10/05/17   Pricilla Loveless, MD    Family History No family history on file.  Social History Social History   Tobacco Use  . Smoking status: Current Every Day Smoker    Packs/day: 0.25    Years: 10.00    Pack years: 2.50    Types: Cigarettes  . Smokeless tobacco: Never Used  Substance Use Topics  . Alcohol use: No  . Drug use: No     Allergies   Patient has no known allergies.   Review of Systems Review of Systems  Constitutional: Positive for chills and fever.  HENT: Negative for sore throat.   Respiratory: Positive for cough. Negative for shortness of breath.   Gastrointestinal: Positive for diarrhea, nausea and vomiting. Negative for abdominal pain.  Genitourinary: Negative for dysuria.  Neurological: Negative for headaches.  All other systems reviewed and are negative.    Physical Exam Updated Vital Signs BP (!) 147/88 (BP Location: Left Arm)   Pulse 91   Temp 97.8 F (36.6 C) (Oral)   Resp 16   LMP 09/28/2017   SpO2 98%   Physical Exam  Constitutional: She is oriented to person, place, and time. She appears well-developed and well-nourished. No distress.  HENT:  Head: Normocephalic and atraumatic.  Right Ear: External ear normal.  Left Ear: External ear normal.  Nose: Nose  normal.  Mouth/Throat: No oropharyngeal exudate.  Eyes: Right eye exhibits no discharge. Left eye exhibits no discharge.  Cardiovascular: Normal rate, regular rhythm and normal heart sounds.  Pulmonary/Chest: Effort normal and breath sounds normal. She has no wheezes. She has no rales.  Abdominal: Soft. She exhibits no distension. There is no tenderness.  Neurological: She is alert and oriented to person, place, and time.  Skin: Skin is warm and dry. She is not diaphoretic.  Nursing note and vitals reviewed.    ED Treatments / Results  Labs (all labs ordered are listed, but only abnormal results are displayed) Labs Reviewed - No data to  display  EKG  EKG Interpretation None       Radiology No results found.  Procedures Procedures (including critical care time)  Medications Ordered in ED Medications  ondansetron (ZOFRAN-ODT) disintegrating tablet 4 mg (not administered)     Initial Impression / Assessment and Plan / ED Course  I have reviewed the triage vital signs and the nursing notes.  Pertinent labs & imaging results that were available during my care of the patient were reviewed by me and considered in my medical decision making (see chart for details).     Patient with about 24 hours of flulike illness.  She has a nonproductive cough, no fever here, and no increased work of breathing or abnormal lung sounds.  My suspicion for pneumonia or other bacterial illness is low.  I think she likely has flu.  I discussed risks/benefits and possible side effects of Tamiflu and she declines treatment for this.  Offered IV fluids given some likely mild dehydration but she declines and would rather have nausea medicine and p.o. fluids.  I discussed the importance of keeping up with her hydration and that she can easily get dehydrated with this disease.  She understands and understands return precautions.  Otherwise, vital signs are unremarkable and my suspicion of a significant electrolyte abnormality is low and I do not think labs are necessary at this time.  Final Clinical Impressions(s) / ED Diagnoses   Final diagnoses:  Influenza-like illness    ED Discharge Orders        Ordered    ondansetron (ZOFRAN ODT) 4 MG disintegrating tablet  Every 8 hours PRN     10/05/17 [id number]       Pricilla LovelessGoldston, Carlosdaniel Grob, MD 10/05/17 [phone]

## 2017-10-05 NOTE — Discharge Instructions (Signed)
Your symptoms are consistent with influenza/flu.  This can make you dehydrated pretty easily C need to drink plenty of fluids.  If you are having trouble or having consistent vomiting or other signs of dehydration, return to the ER immediately.  Otherwise, come back if you develop a worsening cough or shortness of breath.

## 2018-07-29 ENCOUNTER — Emergency Department (HOSPITAL_BASED_OUTPATIENT_CLINIC_OR_DEPARTMENT_OTHER): Payer: Self-pay

## 2018-07-29 ENCOUNTER — Encounter (HOSPITAL_BASED_OUTPATIENT_CLINIC_OR_DEPARTMENT_OTHER): Payer: Self-pay | Admitting: *Deleted

## 2018-07-29 ENCOUNTER — Other Ambulatory Visit: Payer: Self-pay

## 2018-07-29 ENCOUNTER — Emergency Department (HOSPITAL_BASED_OUTPATIENT_CLINIC_OR_DEPARTMENT_OTHER)
Admission: EM | Admit: 2018-07-29 | Discharge: 2018-07-29 | Disposition: A | Discharge status: Home / Self Care | Payer: Self-pay | Attending: Emergency Medicine | Admitting: Emergency Medicine

## 2018-07-29 DIAGNOSIS — I10 Essential (primary) hypertension: Secondary | ICD-10-CM | POA: Insufficient documentation

## 2018-07-29 DIAGNOSIS — R072 Precordial pain: Secondary | ICD-10-CM | POA: Insufficient documentation

## 2018-07-29 DIAGNOSIS — F17210 Nicotine dependence, cigarettes, uncomplicated: Secondary | ICD-10-CM | POA: Insufficient documentation

## 2018-07-29 LAB — BASIC METABOLIC PANEL
ANION GAP: 9 (ref 5–15)
BUN: 7 mg/dL (ref 6–20)
CALCIUM: 9.4 mg/dL (ref 8.9–10.3)
CO2: 23 mmol/L (ref 22–32)
Chloride: 106 mmol/L (ref 98–111)
Creatinine, Ser: 0.82 mg/dL (ref 0.44–1.00)
GFR calc Af Amer: >60 mL/min (ref >60)
GLUCOSE: 85 mg/dL (ref 70–99)
POTASSIUM: 3.9 mmol/L (ref 3.5–5.1)
SODIUM: 138 mmol/L (ref 135–145)

## 2018-07-29 LAB — CBC WITH DIFFERENTIAL/PLATELET
Abs Immature Granulocytes: 0.01 10*3/uL (ref 0.00–0.07)
BASOS PCT: 1 %
Basophils Absolute: 0 10*3/uL (ref 0.0–0.1)
EOS ABS: 0.1 10*3/uL (ref 0.0–0.5)
Eosinophils Relative: 2 %
HCT: 37.7 % (ref 36.0–46.0)
Hemoglobin: 11.9 g/dL — ABNORMAL LOW (ref 12.0–15.0)
Immature Granulocytes: 0 %
Lymphocytes Relative: 45 %
Lymphs Abs: 2.6 10*3/uL (ref 0.7–4.0)
MCH: 27.3 pg (ref 26.0–34.0)
MCHC: 31.6 g/dL (ref 30.0–36.0)
MCV: 86.5 fL (ref 80.0–100.0)
MONO ABS: 0.6 10*3/uL (ref 0.1–1.0)
MONOS PCT: 10 %
NEUTROS PCT: 42 %
Neutro Abs: 2.5 10*3/uL (ref 1.7–7.7)
PLATELETS: 394 10*3/uL (ref 150–400)
RBC: 4.36 MIL/uL (ref 3.87–5.11)
RDW: 13.2 % (ref 11.5–15.5)
WBC: 5.8 10*3/uL (ref 4.0–10.5)
nRBC: 0 % (ref 0.0–0.2)

## 2018-07-29 LAB — TROPONIN I

## 2018-07-29 MED ORDER — KETOROLAC TROMETHAMINE 30 MG/ML IJ SOLN
30.0000 mg | Freq: Once | INTRAMUSCULAR | Status: AC
  Administered 2018-07-29: 30 mg via INTRAVENOUS
  Filled 2018-07-29: qty 1

## 2018-07-29 NOTE — Discharge Instructions (Signed)

## 2018-07-29 NOTE — ED Notes (Signed)
NAD at this time. Pt is stable and going home.  

## 2018-07-29 NOTE — ED Triage Notes (Signed)
Woke with pain in her chest and left arm. 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 Limited ROM in her left arm. Pain in her chest is sharp that comes and goes.

## 2018-07-29 NOTE — ED Provider Notes (Signed)
Emergency Department Provider Note   I have reviewed the triage vital signs and the nursing notes.   HISTORY  Chief Complaint Chest Pain   HPI Sherry Clark is a 47 y.o. female with PMH of HTN and tobacco use presents to the emergency department for evaluation of acute onset left chest heaviness.  Patient states that she has a job with a lot of lifting but pain did not start with lifting and pulling today.  She was at home after returning from work when the pain began.  She reports radiation underneath the left breast.  Pain is worse with touching and pressing in the chest wall or moving the left arm.  She denies any heart palpitations or shortness of breath.  No fevers, chills, productive cough.  No prior history of CAD.  No family history of heart attacks.   Past Medical History:  Diagnosis Date  . Hypertension     There are no active problems to display for this patient.   Past Surgical History:  Procedure Laterality Date  . KNEE SURGERY     Left Knee  . TUBAL LIGATION      Allergies Patient has no known allergies.  No family history on file.  Social History Social History   Tobacco Use  . Smoking status: Current Every Day Smoker    Packs/day: 0.25    Years: 10.00    Pack years: 2.50    Types: Cigarettes  . Smokeless tobacco: Never Used  Substance Use Topics  . Alcohol use: No  . Drug use: No    Review of Systems  Constitutional: No fever/chills Eyes: No visual changes. ENT: No sore throat. Cardiovascular: Positive chest pain. Respiratory: Denies shortness of breath. Gastrointestinal: No abdominal pain.  No nausea, no vomiting.  No diarrhea.  No constipation. Genitourinary: Negative for dysuria. Musculoskeletal: Negative for back pain.  Skin: Negative for rash. Neurological: Negative for headaches, focal weakness or numbness.  10-point ROS otherwise negative.  ____________________________________________   PHYSICAL EXAM:  VITAL  SIGNS: ED Triage Vitals  Enc Vitals Group     BP 07/29/18 1356 (!) 162/105     Pulse Rate 07/29/18 1356 71     Resp 07/29/18 1356 18     Temp 07/29/18 1356 98.1 F (36.7 C)     Temp Source 07/29/18 1356 Oral     SpO2 07/29/18 1356 100 %     Weight 07/29/18 1354 190 lb (86.2 kg)     Height 07/29/18 1354 5\' 3"  (1.6 m)     Pain Score 07/29/18 1354 0   Constitutional: Alert and oriented. Well appearing and in no acute distress. Eyes: Conjunctivae are normal.  Head: Atraumatic. Nose: No congestion/rhinnorhea. Mouth/Throat: Mucous membranes are moist.  Neck: No stridor.  Cardiovascular: Normal rate, regular rhythm. Good peripheral circulation. Grossly normal heart sounds.   Respiratory: Normal respiratory effort.  No retractions. Lungs CTAB. Gastrointestinal: Soft and nontender. No distention.  Musculoskeletal: No lower extremity tenderness nor edema. No gross deformities of extremities. Tenderness over the sternum and left anterior chest wall that approximates the patient's pain.  Neurologic:  Normal speech and language. No gross focal neurologic deficits are appreciated.  Skin:  Skin is warm, dry and intact. No rash noted.   ____________________________________________   LABS (all labs ordered are listed, but only abnormal results are displayed)  Labs Reviewed  CBC WITH DIFFERENTIAL/PLATELET - Abnormal; Notable for the following components:      Result Value   Hemoglobin 11.9 (*)  All other components within normal limits  BASIC METABOLIC PANEL  TROPONIN I   ____________________________________________  EKG   EKG Interpretation  Date/Time:  Tuesday July 29 2018 14:01:31 EST Ventricular Rate:  68 PR Interval:  136 QRS Duration: 70 QT Interval:  408 QTC Calculation: 433 R Axis:   62 Text Interpretation:  Normal sinus rhythm Cannot rule out Anterior infarct , age undetermined Abnormal ECG When compared to prior, no significant changes seen.  No STEMI Confirmed by  Theda Belfastegeler, Chris ([id number]) on 07/29/2018 2:50:41 PM       ____________________________________________  RADIOLOGY  Dg Chest 2 View  Result Date: 07/29/2018 CLINICAL DATA:  Woke up with chest pain this morning. 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 EXAM: CHEST - 2 VIEW COMPARISON:  11/17/2015 FINDINGS: The heart size and mediastinal contours are within normal limits. Both lungs are clear. The visualized skeletal structures are unremarkable. IMPRESSION: No active cardiopulmonary disease. Electronically Signed   By: Tollie Ethavid  Kwon M.D.   On: 07/29/2018 15:28    ____________________________________________   PROCEDURES  Procedure(s) performed:   Procedures  None ____________________________________________   INITIAL IMPRESSION / ASSESSMENT AND PLAN / ED COURSE  Pertinent labs & imaging results that were available during my care of the patient were reviewed by me and considered in my medical decision making (see chart for details).  Presents to the emergency department for evaluation of left chest wall pain which began without obvious provocation.  She does have tenderness over the sternum and left chest.  During my examination she is wincing and withdrawing when I press in this area.  She tells me that this is the pain she is experiencing that I am reproducing with pressure.  Despite this, patient does have several risk factors for acute coronary syndrome and I plan for screening labs including troponin.  EKG reviewed which shows no evidence of acute ischemia. Patient with HEART score of 3. Very low PE risk by Wells and PERC negative. No d-dimer at this time. Plan for Toradol pending labs and CXR evaluation.   3:51 PM Labs and chest x-ray reviewed. Labs and imaging reassuring.  No acute findings.  Patient improved with Toradol here.  I did contact information as well as Keriann Rankin discussion regarding return precautions.   At this time, I do not feel there is any  life-threatening condition present. I have reviewed and discussed all results (EKG, imaging, lab, urine as appropriate), exam findings with patient. I have reviewed nursing notes and appropriate previous records.  I feel the patient is safe to be discharged home without further emergent workup. Discussed usual and customary return precautions. Patient and family (if present) verbalize understanding and are comfortable with this plan.  Patient will follow-up with their primary care provider. If they do not have a primary care provider, information for follow-up has been provided to them. All questions have been answered.  ____________________________________________  FINAL CLINICAL IMPRESSION(S) / ED DIAGNOSES  Final diagnoses:  Precordial chest pain    MEDICATIONS GIVEN DURING THIS VISIT:  Medications  ketorolac (TORADOL) 30 MG/ML injection 30 mg (30 mg Intravenous Given 07/29/18 1527)    Note:  This document was prepared using Dragon voice recognition software and may include unintentional dictation errors.  Alona BeneJoshua Fawaz Borquez, MD Emergency Medicine    Jailon Schaible, Arlyss RepressJoshua G, MD 07/29/18 [phone]

## 2019-06-21 ENCOUNTER — Encounter (HOSPITAL_BASED_OUTPATIENT_CLINIC_OR_DEPARTMENT_OTHER): Payer: Self-pay | Admitting: Emergency Medicine

## 2019-06-21 ENCOUNTER — Emergency Department (HOSPITAL_BASED_OUTPATIENT_CLINIC_OR_DEPARTMENT_OTHER)
Admission: EM | Admit: 2019-06-21 | Discharge: 2019-06-21 | Disposition: A | Discharge status: Home / Self Care | Payer: Self-pay | Attending: Emergency Medicine | Admitting: Emergency Medicine

## 2019-06-21 ENCOUNTER — Other Ambulatory Visit: Payer: Self-pay

## 2019-06-21 ENCOUNTER — Emergency Department (HOSPITAL_BASED_OUTPATIENT_CLINIC_OR_DEPARTMENT_OTHER): Payer: Self-pay

## 2019-06-21 DIAGNOSIS — J069 Acute upper respiratory infection, unspecified: Secondary | ICD-10-CM | POA: Insufficient documentation

## 2019-06-21 DIAGNOSIS — H9203 Otalgia, bilateral: Secondary | ICD-10-CM | POA: Insufficient documentation

## 2019-06-21 DIAGNOSIS — F17210 Nicotine dependence, cigarettes, uncomplicated: Secondary | ICD-10-CM | POA: Insufficient documentation

## 2019-06-21 DIAGNOSIS — Z79899 Other long term (current) drug therapy: Secondary | ICD-10-CM | POA: Insufficient documentation

## 2019-06-21 DIAGNOSIS — I10 Essential (primary) hypertension: Secondary | ICD-10-CM | POA: Insufficient documentation

## 2019-06-21 DIAGNOSIS — Z20828 Contact with and (suspected) exposure to other viral communicable diseases: Secondary | ICD-10-CM | POA: Insufficient documentation

## 2019-06-21 NOTE — Discharge Instructions (Addendum)
Please read the attachment on viral upper respiratory infection and cough.  Continue to take Mucinex as directed for your cold and sinus symptoms.  May also consider lozenges and sprays for your sore throat symptoms.  Ibuprofen would also help.  Instructed patient to return to the ED or seek medical attention should they develop any fevers or chills unrelieved by Tylenol or ibuprofen, difficulty breathing, chest pain, uncontrollable nausea or vomiting, or any other new or worsening symptoms.

## 2019-06-21 NOTE — ED Provider Notes (Signed)
[facility] EMERGENCY DEPARTMENT Provider Note   CSN: [id number] Arrival date & time: 06/21/19  1029     History   Chief Complaint Chief Complaint  Patient presents with  . Cough  . Otalgia    HPI Sherry Clark is a 48 y.o. female with past medical history significant for hypertension and 10-pack-year smoking history who presents to the ED for a 2-week history of productive cough, ear discomfort, sore throat, and intermittent headache.  She has been taking Mucinex for her symptoms, with some relief.  She denies any fevers, chills, sick contacts, chest pain, abdominal pain, nausea, vomiting, loose stools, urinary symptoms, dizziness, rhinorrhea, or congestion.  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     HPI  Past Medical History:  Diagnosis Date  . Hypertension     There are no active problems to display for this patient.   Past Surgical History:  Procedure Laterality Date  . KNEE SURGERY     Left Knee  . TUBAL LIGATION       OB History   No obstetric history on file.      Home Medications    Prior to Admission medications   Medication Sig Start Date End Date Taking? Authorizing Provider  albuterol (PROVENTIL HFA;VENTOLIN HFA) 108 (90 Base) MCG/ACT inhaler Inhale 1-2 puffs into the lungs every 6 (six) hours as needed for wheezing or shortness of breath. 11/26/15   Palumbo, April, MD  fluticasone (FLONASE) 50 MCG/ACT nasal spray Place 2 sprays into both nostrils daily. 11/26/15   Palumbo, April, MD  hydrochlorothiazide (HYDRODIURIL) 25 MG tablet Take 1 tablet (25 mg total) by mouth daily. 04/16/16   Geoffery Lyons, MD  omeprazole (PRILOSEC) 40 MG capsule Take 1 capsule (40 mg total) by mouth daily as needed (acid reflux, burning abdominal pain). 04/09/16   Ward, Layla Maw, DO  ondansetron (ZOFRAN ODT) 4 MG disintegrating tablet Take 1 tablet (4 mg total) by mouth every 8  (eight) hours as needed for nausea or vomiting. 10/05/17   Pricilla Loveless, MD    Family History History reviewed. No pertinent family history.  Social History Social History   Tobacco Use  . Smoking status: Current Every Day Smoker    Packs/day: 0.25    Years: 10.00    Pack years: 2.50    Types: Cigarettes  . Smokeless tobacco: Never Used  Substance Use Topics  . Alcohol use: No  . Drug use: No     Allergies   Patient has no known allergies.   Review of Systems Review of Systems  All other systems reviewed and are negative.    Physical Exam Updated Vital Signs BP (!) 171/103 (BP Location: Right Arm)   Pulse 73   Temp 98.4 F (36.9 C) (Oral)   Resp 18   Ht 5\' 5"  (1.651 m)   Wt 84.8 kg   SpO2 100%   BMI 31.12 kg/m   Physical Exam Vitals signs and nursing note reviewed. Exam conducted with a chaperone present.  Constitutional:      Appearance: Normal appearance.  HENT:     Head: Normocephalic and atraumatic.     Right Ear: Tympanic membrane normal.     Left Ear: Tympanic membrane normal.     Nose: No congestion or rhinorrhea.     Mouth/Throat:     Comments: Patent oropharynx, uvula midline, no masses or exudates appreciated.  Mildly erythematous with clear  drainage. Eyes:     General: No scleral icterus.    Conjunctiva/sclera: Conjunctivae normal.  Neck:     Musculoskeletal: Normal range of motion and neck supple. No neck rigidity.  Cardiovascular:     Rate and Rhythm: Normal rate and regular rhythm.     Pulses: Normal pulses.     Heart sounds: Normal heart sounds.  Pulmonary:     Effort: Pulmonary effort is normal. No respiratory distress.  Abdominal:     General: There is no distension.     Palpations: Abdomen is soft.     Tenderness: There is no abdominal tenderness. There is no guarding.  Musculoskeletal:     Comments: No lower leg swelling or edema.  No erythema.  Skin:    General: Skin is dry.  Neurological:     Mental Status: She is  alert.     GCS: GCS eye subscore is 4. GCS verbal subscore is 5. GCS motor subscore is 6.  Psychiatric:        Mood and Affect: Mood normal.        Behavior: Behavior normal.        Thought Content: Thought content normal.      ED Treatments / Results  Labs (all labs ordered are listed, but only abnormal results are displayed) Labs Reviewed  NOVEL CORONAVIRUS, NAA (HOSP ORDER, SEND-OUT TO REF LAB; TAT 18-24 HRS)    EKG None  Radiology Dg Chest Portable 1 View  Result Date: 06/21/2019 CLINICAL DATA:  Cough for 2-3 days. EXAM: PORTABLE CHEST 1 VIEW COMPARISON:  PA and lateral chest 07/29/2018. FINDINGS: Lungs clear. Heart size normal. No pneumothorax or pleural fluid. No acute or focal bony abnormality. IMPRESSION: Negative chest. Electronically Signed   By: Inge Rise M.D.   On: 06/21/2019 12:18    Procedures Procedures (including critical care time)  Medications Ordered in ED Medications - No data to display   Initial Impression / Assessment and Plan / ED Course  I have reviewed the triage vital signs and the nursing notes.  Pertinent labs & imaging results that were available during my care of the patient were reviewed by me and considered in my medical decision making (see chart for details).        Obtain chest x-ray to rule out pneumonia given 2-week history of productive cough.  Reviewed imaging which reveals no evidence of consolidation concerning for pneumonia.  Patient has been afebrile, her vital signs are normal with exception of her elevated blood pressure, and she is hemodynamically stable.  Obtained COVID-19 diagnostic testing.  She will receive her results in the next 24 to 48 hours.  Isolation measures in the meantime, pending result.  Ibuprofen or Tylenol should you develop any fevers or chills.  Continue taking Mucinex.  She has not experienced any sleep difficulty as well for cough, so do not believe that prescription antitussive is necessary at  this point.  Educated her on other conservative measures for symptomatic relief.  Instructed patient to return to the ED or seek medical attention should they develop any fevers or chills unrelieved by Tylenol or ibuprofen, difficulty breathing, chest pain, uncontrollable nausea or vomiting, or any other new or worsening symptoms. Patient voiced understanding and is agreeable to the plan.   The patient was counseled on the dangers of tobacco use, and was advised to quit.  Reviewed strategies to maximize success, including removing cigarettes and smoking materials from environment, stress management, substitution of other forms of reinforcement, support  of family/friends and written materials. Total time was 5 min CPT code 1610999406.    Final Clinical Impressions(s) / ED Diagnoses   Final diagnoses:  Viral URI with cough    ED Discharge Orders    None       Lorelee NewGreen, Caidyn Blossom L, PA-C 06/21/19 1230    Alvira MondaySchlossman, Erin, MD 06/22/19 1215

## 2019-06-21 NOTE — ED Triage Notes (Signed)
Pt here with left ear pain and productive cough x 2 weeks.

## 2019-06-22 LAB — NOVEL CORONAVIRUS, NAA (HOSP ORDER, SEND-OUT TO REF LAB; TAT 18-24 HRS): SARS-CoV-2, NAA: NOT DETECTED

## 2019-08-22 ENCOUNTER — Other Ambulatory Visit: Payer: Self-pay

## 2019-08-22 ENCOUNTER — Encounter (HOSPITAL_BASED_OUTPATIENT_CLINIC_OR_DEPARTMENT_OTHER): Payer: Self-pay | Admitting: *Deleted

## 2019-08-22 ENCOUNTER — Emergency Department (HOSPITAL_BASED_OUTPATIENT_CLINIC_OR_DEPARTMENT_OTHER)
Admission: EM | Admit: 2019-08-22 | Discharge: 2019-08-22 | Disposition: A | Discharge status: Home / Self Care | Payer: Medicaid Other | Attending: Emergency Medicine | Admitting: Emergency Medicine

## 2019-08-22 DIAGNOSIS — K0889 Other specified disorders of teeth and supporting structures: Secondary | ICD-10-CM | POA: Insufficient documentation

## 2019-08-22 DIAGNOSIS — I10 Essential (primary) hypertension: Secondary | ICD-10-CM | POA: Insufficient documentation

## 2019-08-22 DIAGNOSIS — F17210 Nicotine dependence, cigarettes, uncomplicated: Secondary | ICD-10-CM | POA: Insufficient documentation

## 2019-08-22 DIAGNOSIS — Z79899 Other long term (current) drug therapy: Secondary | ICD-10-CM | POA: Insufficient documentation

## 2019-08-22 MED ORDER — NAPROXEN 500 MG PO TABS: 500.0000 mg | ORAL_TABLET | Freq: Two times a day (BID) | ORAL | 0 refills | Status: AC

## 2019-08-22 MED ORDER — AMOXICILLIN 500 MG PO CAPS: 500.0000 mg | ORAL_CAPSULE | Freq: Three times a day (TID) | ORAL | 0 refills | Status: AC

## 2019-08-22 NOTE — Discharge Instructions (Signed)
Amoxicillin 3 times a day for 10 days, naproxen twice daily for pain  Seek medical exam for increasing pain swelling or fevers.  See a dentist within 10 days for recheck.

## 2019-08-22 NOTE — ED Provider Notes (Signed)
[facility] EMERGENCY DEPARTMENT Provider Note   CSN: [id number] Arrival date & time: 08/22/19  1700     History Chief Complaint  Patient presents with  . Facial Pain    Sherry Clark is a 48 y.o. female.  HPI     This patient is a 48 year old female, she has a known history of hypertension, she denies allergies to medications.  She reports having left-sided dental pain which has been ongoing for 3 weeks intermittently but seems to be worse over the last 3 days.  It has increasing pain and tenderness when she chews, she denies associated fevers or swelling of the face and has been able to open and close her mouth without difficulty.  The pain is worse, it is not seeming to get better with over-the-counter medications.  She denies nausea vomiting fevers chills or diarrhea.  She has not been able to see a dentist but states she should be able to see someone within the next month  Past Medical History:  Diagnosis Date  . Hypertension     There are no problems to display for this patient.   Past Surgical History:  Procedure Laterality Date  . KNEE SURGERY     Left Knee  . TUBAL LIGATION       OB History   No obstetric history on file.     No family history on file.  Social History   Tobacco Use  . Smoking status: Current Every Day Smoker    Packs/day: 0.25    Years: 10.00    Pack years: 2.50    Types: Cigarettes  . Smokeless tobacco: Never Used  Substance Use Topics  . Alcohol use: Not Currently    Comment: occasional  . Drug use: No    Home Medications Prior to Admission medications   Medication Sig Start Date End Date Taking? Authorizing Provider  albuterol (PROVENTIL HFA;VENTOLIN HFA) 108 (90 Base) MCG/ACT inhaler Inhale 1-2 puffs into the lungs every 6 (six) hours as needed for wheezing or shortness of breath. 11/26/15   Palumbo, April, MD  amoxicillin (AMOXIL) 500 MG capsule Take 1 capsule (500 mg total) by mouth 3 (three) times daily.  08/22/19   Noemi Chapel, MD  fluticasone (FLONASE) 50 MCG/ACT nasal spray Place 2 sprays into both nostrils daily. 11/26/15   Palumbo, April, MD  hydrochlorothiazide (HYDRODIURIL) 25 MG tablet Take 1 tablet (25 mg total) by mouth daily. 04/16/16   Veryl Speak, MD  naproxen (NAPROSYN) 500 MG tablet Take 1 tablet (500 mg total) by mouth 2 (two) times daily with a meal. 08/22/19   Noemi Chapel, MD  omeprazole (PRILOSEC) 40 MG capsule Take 1 capsule (40 mg total) by mouth daily as needed (acid reflux, burning abdominal pain). 04/09/16   Ward, Delice Bison, DO  ondansetron (ZOFRAN ODT) 4 MG disintegrating tablet Take 1 tablet (4 mg total) by mouth every 8 (eight) hours as needed for nausea or vomiting. 10/05/17   Sherwood Gambler, MD    Allergies    Patient has no known allergies.  Review of Systems   Review of Systems  Constitutional: Negative for fever.  HENT: Positive for dental problem.     Physical Exam Updated Vital Signs BP (!) 146/97 (BP Location: Left Arm)   Pulse 92   Temp 99.1 F (37.3 C) (Oral)   Resp 16   Ht 1.6 m (5\' 3" )   Wt 81.6 kg   LMP 08/03/2019 (Approximate)   SpO2 100%   BMI 31.89  kg/m   Physical Exam Vitals and nursing note reviewed.  Constitutional:      General: She is not in acute distress.    Appearance: She is well-developed. She is not diaphoretic.  HENT:     Head: Normocephalic and atraumatic.     Mouth/Throat:     Pharynx: No oropharyngeal exudate.     Comments: No objective swelling of the face, the maxilla or the mandible.  The oropharynx is clear and moist.  There are multiple missing teeth, there is no trismus or torticollis, the tongue is nontender nor the sublingual area and there is no lymphadenopathy of the neck.  There is mild tenderness over the left lower first premolar as well as the left upper first and second premolars.  There are obvious dental defects, deep caries and missing portions of teeth which have been filled with a calcium product  over-the-counter by the patient.  There is no surrounding gingival swelling or abscesses. Eyes:     General: No scleral icterus.    Conjunctiva/sclera: Conjunctivae normal.  Neck:     Thyroid: No thyromegaly.  Cardiovascular:     Rate and Rhythm: Normal rate and regular rhythm.  Pulmonary:     Effort: Pulmonary effort is normal.     Breath sounds: Normal breath sounds.  Musculoskeletal:     Cervical back: Normal range of motion and neck supple.  Lymphadenopathy:     Cervical: No cervical adenopathy.  Skin:    General: Skin is warm and dry.     Findings: No rash.  Neurological:     Mental Status: She is alert.     ED Results / Procedures / Treatments   Labs (all labs ordered are listed, but only abnormal results are displayed) Labs Reviewed - No data to display  EKG None  Radiology No results found.  Procedures Procedures (including critical care time)  Medications Ordered in ED Medications - No data to display  ED Course  I have reviewed the triage vital signs and the nursing notes.  Pertinent labs & imaging results that were available during my care of the patient were reviewed by me and considered in my medical decision making (see chart for details).    MDM Rules/Calculators/A&P     This patient is well-appearing with what appears to be dental pain likely related to pulpitis but could be related to early dental abscess.  No objective findings of infection, at this time the patient will be treated with amoxicillin, ibuprofen, follow-up with dentist, she is agreeable.  Vital signs reviewed and stable.  The patient is agreeable to discharge and understands the indications for return.  Final Clinical Impression(s) / ED Diagnoses Final diagnoses:  Pain, dental    Rx / DC Orders ED Discharge Orders         Ordered    amoxicillin (AMOXIL) 500 MG capsule  3 times daily     08/22/19 1732    naproxen (NAPROSYN) 500 MG tablet  2 times daily with meals      08/22/19 1732           Eber Hong, MD 08/22/19 1733

## 2019-08-22 NOTE — ED Triage Notes (Signed)
Pt reports left side facial/tooth pain since Thursday. States pain improved but then worsened again last night

## 2019-12-30 ENCOUNTER — Other Ambulatory Visit: Payer: Self-pay

## 2019-12-30 ENCOUNTER — Encounter (HOSPITAL_BASED_OUTPATIENT_CLINIC_OR_DEPARTMENT_OTHER): Payer: Self-pay | Admitting: *Deleted

## 2019-12-30 ENCOUNTER — Emergency Department (HOSPITAL_BASED_OUTPATIENT_CLINIC_OR_DEPARTMENT_OTHER)
Admission: EM | Admit: 2019-12-30 | Discharge: 2019-12-30 | Disposition: A | Discharge status: Home / Self Care | Payer: Self-pay | Attending: Emergency Medicine | Admitting: Emergency Medicine

## 2019-12-30 DIAGNOSIS — A598 Trichomoniasis of other sites: Secondary | ICD-10-CM | POA: Insufficient documentation

## 2019-12-30 DIAGNOSIS — R1033 Periumbilical pain: Secondary | ICD-10-CM | POA: Insufficient documentation

## 2019-12-30 DIAGNOSIS — R112 Nausea with vomiting, unspecified: Secondary | ICD-10-CM | POA: Insufficient documentation

## 2019-12-30 DIAGNOSIS — F17210 Nicotine dependence, cigarettes, uncomplicated: Secondary | ICD-10-CM | POA: Insufficient documentation

## 2019-12-30 DIAGNOSIS — I10 Essential (primary) hypertension: Secondary | ICD-10-CM | POA: Insufficient documentation

## 2019-12-30 DIAGNOSIS — R197 Diarrhea, unspecified: Secondary | ICD-10-CM

## 2019-12-30 DIAGNOSIS — Z20822 Contact with and (suspected) exposure to covid-19: Secondary | ICD-10-CM | POA: Insufficient documentation

## 2019-12-30 DIAGNOSIS — Z79899 Other long term (current) drug therapy: Secondary | ICD-10-CM | POA: Insufficient documentation

## 2019-12-30 DIAGNOSIS — A599 Trichomoniasis, unspecified: Secondary | ICD-10-CM

## 2019-12-30 LAB — URINALYSIS, ROUTINE W REFLEX MICROSCOPIC
Bilirubin Urine: NEGATIVE
Glucose, UA: NEGATIVE mg/dL
Hgb urine dipstick: NEGATIVE
Ketones, ur: 15 mg/dL — AB
Nitrite: NEGATIVE
Protein, ur: 30 mg/dL — AB
Specific Gravity, Urine: 1.025 (ref 1.005–1.030)
pH: 6 (ref 5.0–8.0)

## 2019-12-30 LAB — COMPREHENSIVE METABOLIC PANEL
ALT: 19 U/L (ref 0–44)
AST: 23 U/L (ref 15–41)
Albumin: 3.8 g/dL (ref 3.5–5.0)
Alkaline Phosphatase: 51 U/L (ref 38–126)
Anion gap: 9 (ref 5–15)
BUN: 8 mg/dL (ref 6–20)
CO2: 23 mmol/L (ref 22–32)
Calcium: 9.2 mg/dL (ref 8.9–10.3)
Chloride: 105 mmol/L (ref 98–111)
Creatinine, Ser: 0.88 mg/dL (ref 0.44–1.00)
GFR calc Af Amer: >60 mL/min (ref >60)
GFR calc non Af Amer: >60 mL/min (ref >60)
Glucose, Bld: 104 mg/dL — ABNORMAL HIGH (ref 70–99)
Potassium: 4.2 mmol/L (ref 3.5–5.1)
Sodium: 137 mmol/L (ref 135–145)
Total Bilirubin: 0.2 mg/dL — ABNORMAL LOW (ref 0.3–1.2)
Total Protein: 7.6 g/dL (ref 6.5–8.1)

## 2019-12-30 LAB — CBC WITH DIFFERENTIAL/PLATELET
Abs Immature Granulocytes: 0 10*3/uL (ref 0.00–0.07)
Basophils Absolute: 0 10*3/uL (ref 0.0–0.1)
Basophils Relative: 0 %
Eosinophils Absolute: 0.1 10*3/uL (ref 0.0–0.5)
Eosinophils Relative: 2 %
HCT: 40.3 % (ref 36.0–46.0)
Hemoglobin: 13 g/dL (ref 12.0–15.0)
Immature Granulocytes: 0 %
Lymphocytes Relative: 50 %
Lymphs Abs: 2.4 10*3/uL (ref 0.7–4.0)
MCH: 28 pg (ref 26.0–34.0)
MCHC: 32.3 g/dL (ref 30.0–36.0)
MCV: 86.7 fL (ref 80.0–100.0)
Monocytes Absolute: 0.6 10*3/uL (ref 0.1–1.0)
Monocytes Relative: 13 %
Neutro Abs: 1.6 10*3/uL — ABNORMAL LOW (ref 1.7–7.7)
Neutrophils Relative %: 35 %
Platelets: 386 10*3/uL (ref 150–400)
RBC: 4.65 MIL/uL (ref 3.87–5.11)
RDW: 13.2 % (ref 11.5–15.5)
WBC: 4.7 10*3/uL (ref 4.0–10.5)
nRBC: 0 % (ref 0.0–0.2)

## 2019-12-30 LAB — URINALYSIS, MICROSCOPIC (REFLEX)

## 2019-12-30 LAB — PREGNANCY, URINE: Preg Test, Ur: NEGATIVE

## 2019-12-30 LAB — HIV ANTIBODY (ROUTINE TESTING W REFLEX): HIV Screen 4th Generation wRfx: NONREACTIVE

## 2019-12-30 LAB — LIPASE, BLOOD: Lipase: 51 U/L (ref 11–51)

## 2019-12-30 MED ORDER — AZITHROMYCIN 250 MG PO TABS
1000.0000 mg | ORAL_TABLET | Freq: Once | ORAL | Status: AC
  Administered 2019-12-30: 1000 mg via ORAL
  Filled 2019-12-30: qty 4

## 2019-12-30 MED ORDER — CEFTRIAXONE SODIUM 500 MG IJ SOLR
500.0000 mg | Freq: Once | INTRAMUSCULAR | Status: AC
  Administered 2019-12-30: 500 mg via INTRAMUSCULAR
  Filled 2019-12-30: qty 500

## 2019-12-30 MED ORDER — ONDANSETRON 4 MG PO TBDP: 4.0000 mg | ORAL_TABLET | ORAL | 0 refills | Status: AC | PRN

## 2019-12-30 MED ORDER — ONDANSETRON 4 MG PO TBDP
4.0000 mg | ORAL_TABLET | Freq: Once | ORAL | Status: AC
  Administered 2019-12-30: 4 mg via ORAL
  Filled 2019-12-30: qty 1

## 2019-12-30 MED ORDER — METRONIDAZOLE 500 MG PO TABS: 500.0000 mg | ORAL_TABLET | Freq: Two times a day (BID) | ORAL | 0 refills | Status: DC

## 2019-12-30 MED ORDER — LIDOCAINE HCL (PF) 1 % IJ SOLN
INTRAMUSCULAR | Status: AC
  Filled 2019-12-30: qty 5

## 2019-12-30 NOTE — Discharge Instructions (Signed)
1.  Fill your prescription for Flagyl.  Take twice daily as prescribed for a week.  Zofran as prescribed to take for nausea as needed. 2.  [facility]-up at the [facility] department within the next 10 days. 3.  Return to the emergency department if you are having worsening pain, fevers, vomiting returns or other concerning symptoms.

## 2019-12-30 NOTE — ED Notes (Signed)
ED Provider at bedside. 

## 2019-12-30 NOTE — ED Triage Notes (Signed)
pt c/o /v/d x 3 days , family at home with same

## 2019-12-30 NOTE — ED Provider Notes (Signed)
[facility] EMERGENCY DEPARTMENT Provider Note   CSN: [id number] Arrival date & time: 12/30/19  1527     History Chief Complaint  Patient presents with  . Emesis    Sherry Clark is a 49 y.o. female.  HPI Patient reports she has had diarrhea and vomiting for the past 2 days.  She reports the vomiting has stopped but she still has several episodes of diarrhea today.  She reports she has some ongoing nausea and crampy discomfort in her central abdomen.  No fever documented.  Reports she did have some frontal headache.  No Covid exposure that she knows of.  Patient denies pain or burning to urination.  She denies vaginal drainage or discharge.  No lightheadedness or syncope.  Patient reports she is otherwise well without significant medical problems.  There are family members at home with similar symptoms.  Patient's UA did come back positive for trichomonas, she reports she was sexually active 3 months ago.  She has not been having any symptoms of pelvic pain or drainage or discharge.    Past Medical History:  Diagnosis Date  . Hypertension     There are no problems to display for this patient.   Past Surgical History:  Procedure Laterality Date  . KNEE SURGERY     Left Knee  . TUBAL LIGATION       OB History   No obstetric history on file.     No family history on file.  Social History   Tobacco Use  . Smoking status: Current Every Day Smoker    Packs/day: 0.25    Years: 10.00    Pack years: 2.50    Types: Cigarettes  . Smokeless tobacco: Never Used  Substance Use Topics  . Alcohol use: Not Currently    Comment: occasional  . Drug use: No    Home Medications Prior to Admission medications   Medication Sig Start Date End Date Taking? Authorizing Provider  albuterol (PROVENTIL HFA;VENTOLIN HFA) 108 (90 Base) MCG/ACT inhaler Inhale 1-2 puffs into the lungs every 6 (six) hours as needed for wheezing or shortness of breath. 11/26/15   Palumbo,  April, MD  amoxicillin (AMOXIL) 500 MG capsule Take 1 capsule (500 mg total) by mouth 3 (three) times daily. 08/22/19   Eber Hong, MD  fluticasone (FLONASE) 50 MCG/ACT nasal spray Place 2 sprays into both nostrils daily. 11/26/15   Palumbo, April, MD  hydrochlorothiazide (HYDRODIURIL) 25 MG tablet Take 1 tablet (25 mg total) by mouth daily. 04/16/16   Geoffery Lyons, MD  metroNIDAZOLE (FLAGYL) 500 MG tablet Take 1 tablet (500 mg total) by mouth 2 (two) times daily. One po bid x 7 days 12/30/19   Arby Barrette, MD  naproxen (NAPROSYN) 500 MG tablet Take 1 tablet (500 mg total) by mouth 2 (two) times daily with a meal. 08/22/19   Eber Hong, MD  omeprazole (PRILOSEC) 40 MG capsule Take 1 capsule (40 mg total) by mouth daily as needed (acid reflux, burning abdominal pain). 04/09/16   Ward, Layla Maw, DO  ondansetron (ZOFRAN ODT) 4 MG disintegrating tablet Take 1 tablet (4 mg total) by mouth every 8 (eight) hours as needed for nausea or vomiting. 10/05/17   Pricilla Loveless, MD  ondansetron (ZOFRAN ODT) 4 MG disintegrating tablet Take 1 tablet (4 mg total) by mouth every 4 (four) hours as needed for nausea or vomiting. 12/30/19   Arby Barrette, MD    Allergies    Patient has no known allergies.  Review of Systems   Review of Systems 10 Systems reviewed and are negative for acute change except as noted in the HPI. Physical Exam Updated Vital Signs BP 139/84   Pulse 95   Temp 98.3 F (36.8 C) (Oral)   Resp 18   Ht 5\' 3"  (1.6 m)   Wt 86.2 kg   LMP 12/10/2019   SpO2 97%   BMI 33.66 kg/m   Physical Exam Constitutional:      Comments: Patient is alert and nontoxic.  Clinically well in appearance.  No respiratory distress.  HENT:     Head: Normocephalic and atraumatic.     Mouth/Throat:     Mouth: Mucous membranes are moist.     Pharynx: Oropharynx is clear.  Eyes:     Extraocular Movements: Extraocular movements intact.     Conjunctiva/sclera: Conjunctivae normal.  Cardiovascular:       Rate and Rhythm: Normal rate and regular rhythm.  Pulmonary:     Effort: Pulmonary effort is normal.     Breath sounds: Normal breath sounds.  Abdominal:     Comments: Abdomen soft.  No guarding.  Mild central discomfort to palpation.  No significant lower or suprapubic pain to palpation.  Musculoskeletal:        General: No swelling or tenderness. Normal range of motion.     Right lower leg: No edema.     Left lower leg: No edema.  Skin:    General: Skin is warm and dry.  Neurological:     General: No focal deficit present.     Mental Status: She is oriented to person, place, and time.     Motor: No weakness.     Coordination: Coordination normal.  Psychiatric:        Mood and Affect: Mood normal.     ED Results / Procedures / Treatments   Labs (all labs ordered are listed, but only abnormal results are displayed) Labs Reviewed  URINALYSIS, ROUTINE W REFLEX MICROSCOPIC - Abnormal; Notable for the following components:      Result Value   APPearance CLOUDY (*)    Ketones, ur 15 (*)    Protein, ur 30 (*)    Leukocytes,Ua TRACE (*)    All other components within normal limits  CBC WITH DIFFERENTIAL/PLATELET - Abnormal; Notable for the following components:   Neutro Abs 1.6 (*)    All other components within normal limits  COMPREHENSIVE METABOLIC PANEL - Abnormal; Notable for the following components:   Glucose, Bld 104 (*)    Total Bilirubin 0.2 (*)    All other components within normal limits  URINALYSIS, MICROSCOPIC (REFLEX) - Abnormal; Notable for the following components:   Bacteria, UA MANY (*)    Trichomonas, UA PRESENT (*)    All other components within normal limits  SARS CORONAVIRUS 2 (TAT 6-24 HRS)  PREGNANCY, URINE  LIPASE, BLOOD  RAPID HIV SCREEN (HIV 1/2 AB+AG)  RPR  HEPATITIS PANEL, ACUTE    EKG None  Radiology No results found.  Procedures Procedures (including critical care time)  Medications Ordered in ED Medications  cefTRIAXone  (ROCEPHIN) injection 500 mg (has no administration in time range)  azithromycin (ZITHROMAX) tablet 1,000 mg (has no administration in time range)  ondansetron (ZOFRAN-ODT) disintegrating tablet 4 mg (has no administration in time range)  lidocaine (PF) (XYLOCAINE) 1 % injection (has no administration in time range)    ED Course  I have reviewed the triage vital signs and the nursing notes.  Pertinent  labs & imaging results that were available during my care of the patient were reviewed by me and considered in my medical decision making (see chart for details).    MDM Rules/Calculators/A&P                      Patient presents with some generalized symptoms suggestive of viral syndrome with predominantly nausea vomiting and diarrhea.  Vomiting has resolved with some persisting nausea.  Clinically, patient is well in appearance.  She reports similar sick contacts at home.  Will obtain Covid screening.  No signs of respiratory distress or hypoxia.  Urinalysis was positive for trichomonas.  Will treat empirically for STD and obtain HIV, hepatitis and RPR.  Patient given IM Rocephin and p.o. Zithromax in the emergency department.  Will be discharged with Flagyl and Zofran.  Return precautions reviewed.  Patient advised that she get seen at health department and will go for follow-up recheck. Final Clinical Impression(s) / ED Diagnoses Final diagnoses:  Nausea vomiting and diarrhea  Periumbilical abdominal pain  Trichomoniasis    Rx / DC Orders ED Discharge Orders         Ordered    ondansetron (ZOFRAN ODT) 4 MG disintegrating tablet  Every 4 hours PRN     12/30/19 1811    metroNIDAZOLE (FLAGYL) 500 MG tablet  2 times daily     12/30/19 1811           Charlesetta Shanks, MD 12/30/19 1812

## 2019-12-31 LAB — SARS CORONAVIRUS 2 (TAT 6-24 HRS): SARS Coronavirus 2: NEGATIVE

## 2019-12-31 LAB — HEPATITIS PANEL, ACUTE
HCV Ab: NONREACTIVE
Hep A IgM: NONREACTIVE
Hep B C IgM: NONREACTIVE
Hepatitis B Surface Ag: NONREACTIVE

## 2019-12-31 LAB — RPR: RPR Ser Ql: NONREACTIVE

## 2020-04-09 ENCOUNTER — Encounter (HOSPITAL_BASED_OUTPATIENT_CLINIC_OR_DEPARTMENT_OTHER): Payer: Self-pay

## 2020-04-09 ENCOUNTER — Other Ambulatory Visit: Payer: Self-pay

## 2020-04-09 ENCOUNTER — Emergency Department (HOSPITAL_BASED_OUTPATIENT_CLINIC_OR_DEPARTMENT_OTHER)
Admission: EM | Admit: 2020-04-09 | Discharge: 2020-04-09 | Disposition: A | Discharge status: Home / Self Care | Payer: HRSA Program | Attending: Emergency Medicine | Admitting: Emergency Medicine

## 2020-04-09 DIAGNOSIS — R42 Dizziness and giddiness: Secondary | ICD-10-CM | POA: Diagnosis not present

## 2020-04-09 DIAGNOSIS — R55 Syncope and collapse: Secondary | ICD-10-CM | POA: Diagnosis present

## 2020-04-09 DIAGNOSIS — Z7951 Long term (current) use of inhaled steroids: Secondary | ICD-10-CM | POA: Insufficient documentation

## 2020-04-09 DIAGNOSIS — N939 Abnormal uterine and vaginal bleeding, unspecified: Secondary | ICD-10-CM | POA: Diagnosis not present

## 2020-04-09 DIAGNOSIS — A599 Trichomoniasis, unspecified: Secondary | ICD-10-CM

## 2020-04-09 DIAGNOSIS — I10 Essential (primary) hypertension: Secondary | ICD-10-CM | POA: Insufficient documentation

## 2020-04-09 DIAGNOSIS — Z79899 Other long term (current) drug therapy: Secondary | ICD-10-CM | POA: Diagnosis not present

## 2020-04-09 DIAGNOSIS — U071 COVID-19: Secondary | ICD-10-CM

## 2020-04-09 DIAGNOSIS — F17210 Nicotine dependence, cigarettes, uncomplicated: Secondary | ICD-10-CM | POA: Diagnosis not present

## 2020-04-09 LAB — COMPREHENSIVE METABOLIC PANEL
ALT: 21 U/L (ref 0–44)
AST: 20 U/L (ref 15–41)
Albumin: 3.8 g/dL (ref 3.5–5.0)
Alkaline Phosphatase: 52 U/L (ref 38–126)
Anion gap: 11 (ref 5–15)
BUN: 9 mg/dL (ref 6–20)
CO2: 25 mmol/L (ref 22–32)
Calcium: 9.1 mg/dL (ref 8.9–10.3)
Chloride: 101 mmol/L (ref 98–111)
Creatinine, Ser: 0.98 mg/dL (ref 0.44–1.00)
GFR calc Af Amer: >60 mL/min (ref >60)
GFR calc non Af Amer: >60 mL/min (ref >60)
Glucose, Bld: 97 mg/dL (ref 70–99)
Potassium: 4.2 mmol/L (ref 3.5–5.1)
Sodium: 137 mmol/L (ref 135–145)
Total Bilirubin: 0.3 mg/dL (ref 0.3–1.2)
Total Protein: 7.6 g/dL (ref 6.5–8.1)

## 2020-04-09 LAB — CBC
HCT: 36.8 % (ref 36.0–46.0)
Hemoglobin: 12.1 g/dL (ref 12.0–15.0)
MCH: 28.2 pg (ref 26.0–34.0)
MCHC: 32.9 g/dL (ref 30.0–36.0)
MCV: 85.8 fL (ref 80.0–100.0)
Platelets: 360 10*3/uL (ref 150–400)
RBC: 4.29 MIL/uL (ref 3.87–5.11)
RDW: 14.6 % (ref 11.5–15.5)
WBC: 3.9 10*3/uL — ABNORMAL LOW (ref 4.0–10.5)
nRBC: 0 % (ref 0.0–0.2)

## 2020-04-09 LAB — URINALYSIS, ROUTINE W REFLEX MICROSCOPIC
Bilirubin Urine: NEGATIVE
Glucose, UA: NEGATIVE mg/dL
Hgb urine dipstick: NEGATIVE
Ketones, ur: NEGATIVE mg/dL
Nitrite: NEGATIVE
Protein, ur: NEGATIVE mg/dL
Specific Gravity, Urine: 1.02 (ref 1.005–1.030)
pH: 7 (ref 5.0–8.0)

## 2020-04-09 LAB — URINALYSIS, MICROSCOPIC (REFLEX)

## 2020-04-09 LAB — PREGNANCY, URINE: Preg Test, Ur: NEGATIVE

## 2020-04-09 LAB — SARS CORONAVIRUS 2 BY RT PCR (HOSPITAL ORDER, PERFORMED IN ~~LOC~~ HOSPITAL LAB): SARS Coronavirus 2: POSITIVE — AB

## 2020-04-09 MED ORDER — MEGESTROL ACETATE 40 MG PO TABS: 40.0000 mg | ORAL_TABLET | Freq: Two times a day (BID) | ORAL | 0 refills | Status: AC

## 2020-04-09 MED ORDER — METRONIDAZOLE 500 MG PO TABS: 500.0000 mg | ORAL_TABLET | Freq: Two times a day (BID) | ORAL | 0 refills | Status: AC

## 2020-04-09 NOTE — ED Provider Notes (Signed)
[facility] EMERGENCY DEPARTMENT Provider Note   CSN: [id number] Arrival date & time: 04/09/20  1437     History Chief Complaint  Patient presents with  . Near Syncope    COVID+    Sherry Clark is a 49 y.o. female.  49 year old female with history of hypertension who presents with vaginal bleeding and lightheadedness.  Patient states that she has a history of heavy periods but for the past 2 weeks she has had heavy vaginal bleeding, sometimes going through a pad every hour, which is unusually long for her normal periods.  She has had associated lightheadedness/dizziness especially when standing.  No actual syncope episodes.  She also reports 1 week of mild cough associated with congestion.  No fevers, loss of taste/smell, vomiting, diarrhea, or urinary symptoms.  Her son just tested positive for COVID-19.  She denies any shortness of breath.  She is unvaccinated against COVID-19.  The history is provided by the patient.  Near Syncope       Past Medical History:  Diagnosis Date  . Hypertension     There are no problems to display for this patient.   Past Surgical History:  Procedure Laterality Date  . KNEE SURGERY     Left Knee  . TUBAL LIGATION       OB History   No obstetric history on file.     No family history on file.  Social History   Tobacco Use  . Smoking status: Current Every Day Smoker    Packs/day: 0.25    Years: 10.00    Pack years: 2.50    Types: Cigarettes  . Smokeless tobacco: Never Used  Vaping Use  . Vaping Use: Never used  Substance Use Topics  . Alcohol use: Not Currently    Comment: occasional  . Drug use: No    Home Medications Prior to Admission medications   Medication Sig Start Date End Date Taking? Authorizing Provider  albuterol (PROVENTIL HFA;VENTOLIN HFA) 108 (90 Base) MCG/ACT inhaler Inhale 1-2 puffs into the lungs every 6 (six) hours as needed for wheezing or shortness of breath. 11/26/15   Palumbo,  April, MD  amoxicillin (AMOXIL) 500 MG capsule Take 1 capsule (500 mg total) by mouth 3 (three) times daily. 08/22/19   Eber Hong, MD  fluticasone (FLONASE) 50 MCG/ACT nasal spray Place 2 sprays into both nostrils daily. 11/26/15   Palumbo, April, MD  hydrochlorothiazide (HYDRODIURIL) 25 MG tablet Take 1 tablet (25 mg total) by mouth daily. 04/16/16   Geoffery Lyons, MD  megestrol (MEGACE) 40 MG tablet Take 1 tablet (40 mg total) by mouth 2 (two) times daily. Take twice daily until bleeding stops - consult with your Gynecologist before starting this medicine 04/09/20   Marcee Jacobs, Ambrose Finland, MD  metroNIDAZOLE (FLAGYL) 500 MG tablet Take 1 tablet (500 mg total) by mouth 2 (two) times daily. 04/09/20   Miriam Liles, Ambrose Finland, MD  naproxen (NAPROSYN) 500 MG tablet Take 1 tablet (500 mg total) by mouth 2 (two) times daily with a meal. 08/22/19   Eber Hong, MD  omeprazole (PRILOSEC) 40 MG capsule Take 1 capsule (40 mg total) by mouth daily as needed (acid reflux, burning abdominal pain). 04/09/16   Ward, Layla Maw, DO  ondansetron (ZOFRAN ODT) 4 MG disintegrating tablet Take 1 tablet (4 mg total) by mouth every 8 (eight) hours as needed for nausea or vomiting. 10/05/17   Pricilla Loveless, MD  ondansetron (ZOFRAN ODT) 4 MG disintegrating tablet Take 1 tablet (4  mg total) by mouth every 4 (four) hours as needed for nausea or vomiting. 12/30/19   Arby Barrette, MD    Allergies    Patient has no known allergies.  Review of Systems   Review of Systems  Cardiovascular: Positive for near-syncope.   All other systems reviewed and are negative except that which was mentioned in HPI  Physical Exam Updated Vital Signs BP (!) 138/107   Pulse 70   Temp 98.5 F (36.9 C) (Oral)   Resp 20   Ht 5\' 3"  (1.6 m)   Wt (!) 96.2 kg   SpO2 100%   BMI 37.55 kg/m   Physical Exam Vitals and nursing note reviewed.  Constitutional:      General: She is not in acute distress.    Appearance: She is well-developed.    HENT:     Head: Normocephalic and atraumatic.  Eyes:     Conjunctiva/sclera: Conjunctivae normal.  Cardiovascular:     Rate and Rhythm: Normal rate and regular rhythm.     Heart sounds: Normal heart sounds. No murmur heard.   Pulmonary:     Effort: Pulmonary effort is normal.     Breath sounds: Normal breath sounds.  Abdominal:     General: Bowel sounds are normal. There is no distension.     Palpations: Abdomen is soft.     Tenderness: There is no abdominal tenderness.  Musculoskeletal:     Cervical back: Neck supple.     Right lower leg: No edema.     Left lower leg: No edema.  Skin:    General: Skin is warm and dry.  Neurological:     Mental Status: She is alert and oriented to person, place, and time.     Comments: Fluent speech  Psychiatric:        Judgment: Judgment normal.     ED Results / Procedures / Treatments   Labs (all labs ordered are listed, but only abnormal results are displayed) Labs Reviewed  SARS CORONAVIRUS 2 BY RT PCR (HOSPITAL ORDER, PERFORMED IN [facility]) - Abnormal; Notable for the following components:      Result Value   SARS Coronavirus 2 POSITIVE (*)    All other components within normal limits  CBC - Abnormal; Notable for the following components:   WBC 3.9 (*)    All other components within normal limits  URINALYSIS, ROUTINE W REFLEX MICROSCOPIC - Abnormal; Notable for the following components:   APPearance CLOUDY (*)    Leukocytes,Ua TRACE (*)    All other components within normal limits  URINALYSIS, MICROSCOPIC (REFLEX) - Abnormal; Notable for the following components:   Bacteria, UA FEW (*)    Trichomonas, UA PRESENT (*)    All other components within normal limits  PREGNANCY, URINE  COMPREHENSIVE METABOLIC PANEL    EKG EKG Interpretation  Date/Time:  Saturday April 09 2020 15:27:51 EDT Ventricular Rate:  76 PR Interval:  128 QRS Duration: 78 QT Interval:  350 QTC Calculation: 393 R Axis:   37 Text  Interpretation: Normal sinus rhythm Nonspecific ST and T wave abnormality Abnormal ECG No significant change since last tracing Confirmed by [id number] [phone]) on 04/09/2020 5:28:54 PM   Radiology No results found.  Procedures Procedures (including critical care time)  Medications Ordered in ED Medications - No data to display  ED Course  I have reviewed the triage vital signs and the nursing notes.  Pertinent labs & imaging results that were available during my  care of the patient were reviewed by me and considered in my medical decision making (see chart for details).    MDM Rules/Calculators/A&P                          Well-appearing on exam, reassuring vital signs, afebrile, O2 saturation 100% on room air with normal work of breathing.  She did has positive for COVID-19, it sounds like her symptoms have been going on for 1 week and she has no signs of respiratory failure.  Discussed supportive measures, need for quarantine, notification of close contacts.  Reviewed return precautions regarding Covid symptoms.  Regarding vaginal bleeding,Hgb normal and pt reports h/o heavy bleeding. UPT negative.  Advised that patient does need to be seen by GYN but I could provide with Megace in the interim to stop bleeding.  Emphasized that this is only a temporary fix and she needs further work-up and definitive management.  She did have Trichomonas in urine, is denying any abdominal pain.  Will treat with course of Flagyl, cautioned on potential side effects.  Sherry Clark was evaluated in Emergency Department on 04/09/2020 for the symptoms described in the history of present illness. She was evaluated in the context of the global COVID-19 pandemic, which necessitated consideration that the patient might be at risk for infection with the SARS-CoV-2 virus that causes COVID-19. Institutional protocols and algorithms that pertain to the evaluation of patients at risk for COVID-19 are in a state  of rapid change based on information released by regulatory bodies including the CDC and federal and state organizations. These policies and algorithms were followed during the patient's care in the ED.  Final Clinical Impression(s) / ED Diagnoses Final diagnoses:  Lightheadedness  Episode of heavy vaginal bleeding  COVID-19 virus infection  Trichomonas infection    Rx / DC Orders ED Discharge Orders         Ordered    megestrol (MEGACE) 40 MG tablet  2 times daily     Discontinue  Reprint     04/09/20 2027    metroNIDAZOLE (FLAGYL) 500 MG tablet  2 times daily     Discontinue  Reprint     04/09/20 2101           Maddyson Keil, Ambrose Finland, MD 04/09/20 2123

## 2020-04-09 NOTE — ED Notes (Signed)
Pt discharged to home. Discharge instructions have been discussed with patient and/or family members. Pt verbally acknowledges understanding d/c instructions, and endorses comprehension to checkout at registration before leaving. Pt would not allow RN to recheck b/p at time of discharge.

## 2020-04-09 NOTE — ED Triage Notes (Signed)
Pt states in past 2 weeks she had heavy vaginal bleeding where she saturated through a pad every hour. Pt having lightheadedness and reports that she nearly passes out upon standing.   Pt also has positive contact with COVID pt.

## 2020-04-10 ENCOUNTER — Telehealth: Payer: Self-pay | Admitting: Adult Health

## 2020-04-10 NOTE — Telephone Encounter (Signed)
Called and LMOM regarding monoclonal antibody treatment for COVID 19 given to those who are at risk for complications and/or hospitalization of the virus.  Patient meets criteria based on: BMI greater than 25.    Call back number given: [phone]  My chart message: unable to send  Khameron Gruenwald, NP  

## 2020-05-16 ENCOUNTER — Other Ambulatory Visit: Payer: Self-pay

## 2020-05-16 ENCOUNTER — Encounter (HOSPITAL_BASED_OUTPATIENT_CLINIC_OR_DEPARTMENT_OTHER): Payer: Self-pay | Admitting: Emergency Medicine

## 2020-05-16 ENCOUNTER — Emergency Department (HOSPITAL_BASED_OUTPATIENT_CLINIC_OR_DEPARTMENT_OTHER): Payer: Medicaid Other

## 2020-05-16 ENCOUNTER — Emergency Department (HOSPITAL_BASED_OUTPATIENT_CLINIC_OR_DEPARTMENT_OTHER)
Admission: EM | Admit: 2020-05-16 | Discharge: 2020-05-16 | Disposition: A | Discharge status: Home / Self Care | Payer: Medicaid Other | Attending: Emergency Medicine | Admitting: Emergency Medicine

## 2020-05-16 DIAGNOSIS — R079 Chest pain, unspecified: Secondary | ICD-10-CM | POA: Insufficient documentation

## 2020-05-16 DIAGNOSIS — F17210 Nicotine dependence, cigarettes, uncomplicated: Secondary | ICD-10-CM | POA: Insufficient documentation

## 2020-05-16 DIAGNOSIS — R05 Cough: Secondary | ICD-10-CM | POA: Insufficient documentation

## 2020-05-16 DIAGNOSIS — I10 Essential (primary) hypertension: Secondary | ICD-10-CM | POA: Insufficient documentation

## 2020-05-16 DIAGNOSIS — Z79899 Other long term (current) drug therapy: Secondary | ICD-10-CM | POA: Insufficient documentation

## 2020-05-16 DIAGNOSIS — R0789 Other chest pain: Secondary | ICD-10-CM

## 2020-05-16 LAB — BASIC METABOLIC PANEL
Anion gap: 9 (ref 5–15)
BUN: 14 mg/dL (ref 6–20)
CO2: 21 mmol/L — ABNORMAL LOW (ref 22–32)
Calcium: 9 mg/dL (ref 8.9–10.3)
Chloride: 108 mmol/L (ref 98–111)
Creatinine, Ser: 0.83 mg/dL (ref 0.44–1.00)
GFR calc Af Amer: >60 mL/min (ref >60)
GFR calc non Af Amer: >60 mL/min (ref >60)
Glucose, Bld: 106 mg/dL — ABNORMAL HIGH (ref 70–99)
Potassium: 3.5 mmol/L (ref 3.5–5.1)
Sodium: 138 mmol/L (ref 135–145)

## 2020-05-16 LAB — CBC WITH DIFFERENTIAL/PLATELET
Abs Immature Granulocytes: 0.01 10*3/uL (ref 0.00–0.07)
Basophils Absolute: 0 10*3/uL (ref 0.0–0.1)
Basophils Relative: 1 %
Eosinophils Absolute: 0.1 10*3/uL (ref 0.0–0.5)
Eosinophils Relative: 2 %
HCT: 36.9 % (ref 36.0–46.0)
Hemoglobin: 12.3 g/dL (ref 12.0–15.0)
Immature Granulocytes: 0 %
Lymphocytes Relative: 54 %
Lymphs Abs: 3.4 10*3/uL (ref 0.7–4.0)
MCH: 28.9 pg (ref 26.0–34.0)
MCHC: 33.3 g/dL (ref 30.0–36.0)
MCV: 86.8 fL (ref 80.0–100.0)
Monocytes Absolute: 0.8 10*3/uL (ref 0.1–1.0)
Monocytes Relative: 12 %
Neutro Abs: 2 10*3/uL (ref 1.7–7.7)
Neutrophils Relative %: 31 %
Platelets: 349 10*3/uL (ref 150–400)
RBC: 4.25 MIL/uL (ref 3.87–5.11)
RDW: 14 % (ref 11.5–15.5)
WBC: 6.3 10*3/uL (ref 4.0–10.5)
nRBC: 0 % (ref 0.0–0.2)

## 2020-05-16 LAB — TROPONIN I (HIGH SENSITIVITY): Troponin I (High Sensitivity): 3 ng/L (ref <18)

## 2020-05-16 LAB — PREGNANCY, URINE: Preg Test, Ur: NEGATIVE

## 2020-05-16 MED ORDER — MELOXICAM 7.5 MG PO TABS
7.5000 mg | ORAL_TABLET | Freq: Every day | ORAL | 0 refills | Status: AC
Start: 2020-05-16 — End: ?

## 2020-05-16 MED ORDER — KETOROLAC TROMETHAMINE 30 MG/ML IJ SOLN
30.0000 mg | Freq: Once | INTRAMUSCULAR | Status: AC
  Administered 2020-05-16: 30 mg via INTRAVENOUS
  Filled 2020-05-16: qty 1

## 2020-05-16 MED ORDER — IOHEXOL 350 MG/ML SOLN
100.0000 mL | Freq: Once | INTRAVENOUS | Status: AC | PRN
  Administered 2020-05-16: 100 mL via INTRAVENOUS

## 2020-05-16 NOTE — ED Triage Notes (Signed)
Reports left chest pain with cough for the last week.  Denies any fever or sob.

## 2020-05-16 NOTE — ED Provider Notes (Signed)
[facility] EMERGENCY DEPARTMENT Provider Note   CSN: [id number] Arrival date & time: 05/16/20  0145     History Chief Complaint  Patient presents with  . Chest Pain    Sherry Clark is a 49 y.o. female.  The history is provided by the patient.  Chest Pain Pain location:  L chest Pain quality: dull   Pain radiates to:  Does not radiate Pain severity:  Moderate Onset quality:  Gradual Duration:  1 week Timing:  Constant Progression:  Unchanged Chronicity:  New Context: movement and at rest   Relieved by:  Nothing Worsened by:  Nothing Ineffective treatments:  None tried Associated symptoms: cough   Associated symptoms: no abdominal pain, no AICD problem, no altered mental status, no anorexia, no anxiety, no back pain, no claudication, no diaphoresis, no dizziness, no dysphagia, no fatigue, no fever, no headache, no heartburn, no lower extremity edema, no nausea, no near-syncope, no numbness, no orthopnea, no palpitations, no PND, no shortness of breath, no syncope, no vomiting and no weakness   Risk factors: no aortic disease   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  No f/c/r.  No leg pain      Past Medical History:  Diagnosis Date  . Hypertension     There are no problems to display for this patient.   Past Surgical History:  Procedure Laterality Date  . KNEE SURGERY     Left Knee  . TUBAL LIGATION       OB History   No obstetric history on file.     History reviewed. No pertinent family history.  Social History   Tobacco Use  . Smoking status: Current Every Day Smoker    Packs/day: 0.25    Years: 10.00    Pack years: 2.50    Types: Cigarettes  . Smokeless tobacco: Never Used  Vaping Use  . Vaping Use: Never used  Substance Use Topics  . Alcohol use: Not Currently    Comment: occasional  . Drug use: No    Home Medications Prior to Admission medications   Medication Sig Start Date End Date  Taking? Authorizing Provider  albuterol (PROVENTIL HFA;VENTOLIN HFA) 108 (90 Base) MCG/ACT inhaler Inhale 1-2 puffs into the lungs every 6 (six) hours as needed for wheezing or shortness of breath. 11/26/15   Winferd Wease, MD  amoxicillin (AMOXIL) 500 MG capsule Take 1 capsule (500 mg total) by mouth 3 (three) times daily. 08/22/19   Eber Hong, MD  fluticasone (FLONASE) 50 MCG/ACT nasal spray Place 2 sprays into both nostrils daily. 11/26/15   Makylie Rivere, MD  hydrochlorothiazide (HYDRODIURIL) 25 MG tablet Take 1 tablet (25 mg total) by mouth daily. 04/16/16   Geoffery Lyons, MD  megestrol (MEGACE) 40 MG tablet Take 1 tablet (40 mg total) by mouth 2 (two) times daily. Take twice daily until bleeding stops - consult with your Gynecologist before starting this medicine 04/09/20   Little, Ambrose Finland, MD  metroNIDAZOLE (FLAGYL) 500 MG tablet Take 1 tablet (500 mg total) by mouth 2 (two) times daily. 04/09/20   Little, Ambrose Finland, MD  naproxen (NAPROSYN) 500 MG tablet Take 1 tablet (500 mg total) by mouth 2 (two) times daily with a meal. 08/22/19   Eber Hong, MD  omeprazole (PRILOSEC) 40 MG capsule Take 1 capsule (40 mg total) by mouth daily as needed (acid reflux, burning abdominal pain). 04/09/16   Ward, Layla Maw, DO  ondansetron (  ZOFRAN ODT) 4 MG disintegrating tablet Take 1 tablet (4 mg total) by mouth every 8 (eight) hours as needed for nausea or vomiting. 10/05/17   Pricilla Loveless, MD  ondansetron (ZOFRAN ODT) 4 MG disintegrating tablet Take 1 tablet (4 mg total) by mouth every 4 (four) hours as needed for nausea or vomiting. 12/30/19   Arby Barrette, MD    Allergies    Patient has no known allergies.  Review of Systems   Review of Systems  Constitutional: Negative for diaphoresis, fatigue and fever.  HENT: Negative for trouble swallowing.   Eyes: Negative for visual disturbance.  Respiratory: Positive for cough. Negative for shortness of breath.   Cardiovascular: Positive for  chest pain. Negative for palpitations, orthopnea, claudication, leg swelling, syncope, PND and near-syncope.  Gastrointestinal: Negative for abdominal pain, anorexia, heartburn, nausea and vomiting.  Genitourinary: Negative for difficulty urinating.  Musculoskeletal: Negative for back pain.  Skin: Negative for rash.  Neurological: Negative for dizziness, weakness, numbness and headaches.  Psychiatric/Behavioral: Negative for agitation.  All other systems reviewed and are negative.   Physical Exam Updated Vital Signs BP 131/80 (BP Location: Right Arm)   Pulse 66   Temp 98.4 F (36.9 C) (Oral)   Resp 18   Ht 5\' 3"  (1.6 m)   Wt 86.2 kg   LMP 05/08/2020   SpO2 99%   BMI 33.66 kg/m   Physical Exam Vitals and nursing note reviewed.  Constitutional:      General: She is not in acute distress.    Appearance: Normal appearance.  HENT:     Head: Normocephalic and atraumatic.     Nose: Nose normal.  Eyes:     Conjunctiva/sclera: Conjunctivae normal.     Pupils: Pupils are equal, round, and reactive to light.  Cardiovascular:     Rate and Rhythm: Normal rate and regular rhythm.     Pulses: Normal pulses.     Heart sounds: Normal heart sounds.  Pulmonary:     Effort: Pulmonary effort is normal.     Breath sounds: Normal breath sounds.  Chest:    Abdominal:     General: Abdomen is flat. Bowel sounds are normal.     Palpations: Abdomen is soft.     Tenderness: There is no abdominal tenderness. There is no guarding.  Musculoskeletal:        General: Normal range of motion.     Cervical back: Normal range of motion and neck supple.  Skin:    General: Skin is warm and dry.     Capillary Refill: Capillary refill takes less than 2 seconds.  Neurological:     General: No focal deficit present.     Mental Status: She is alert and oriented to person, place, and time.     Deep Tendon Reflexes: Reflexes normal.  Psychiatric:        Mood and Affect: Mood normal.        Behavior:  Behavior normal.     ED Results / Procedures / Treatments   Labs (all labs ordered are listed, but only abnormal results are displayed) Results for orders placed or performed during the hospital encounter of 05/16/20  CBC with Differential/Platelet  Result Value Ref Range   WBC 6.3 4.0 - 10.5 K/uL   RBC 4.25 3.87 - 5.11 MIL/uL   Hemoglobin 12.3 12.0 - 15.0 g/dL   HCT 07/16/20 36 - 46 %   MCV 86.8 80.0 - 100.0 fL   MCH 28.9 26.0 - 34.0 pg  MCHC 33.3 30.0 - 36.0 g/dL   RDW 25.3 66.4 - 40.3 %   Platelets 349 150 - 400 K/uL   nRBC 0.0 0.0 - 0.2 %   Neutrophils Relative % 31 %   Neutro Abs 2.0 1.7 - 7.7 K/uL   Lymphocytes Relative 54 %   Lymphs Abs 3.4 0.7 - 4.0 K/uL   Monocytes Relative 12 %   Monocytes Absolute 0.8 0 - 1 K/uL   Eosinophils Relative 2 %   Eosinophils Absolute 0.1 0 - 0 K/uL   Basophils Relative 1 %   Basophils Absolute 0.0 0 - 0 K/uL   Immature Granulocytes 0 %   Abs Immature Granulocytes 0.01 0.00 - 0.07 K/uL  Basic metabolic panel  Result Value Ref Range   Sodium 138 135 - 145 mmol/L   Potassium 3.5 3.5 - 5.1 mmol/L   Chloride 108 98 - 111 mmol/L   CO2 21 (L) 22 - 32 mmol/L   Glucose, Bld 106 (H) 70 - 99 mg/dL   BUN 14 6 - 20 mg/dL   Creatinine, Ser 4.74 0.44 - 1.00 mg/dL   Calcium 9.0 8.9 - 25.9 mg/dL   GFR calc non Af Amer >60 >60 mL/min   GFR calc Af Amer >60 >60 mL/min   Anion gap 9 5 - 15  Pregnancy, urine  Result Value Ref Range   Preg Test, Ur NEGATIVE NEGATIVE  Troponin I (High Sensitivity)  Result Value Ref Range   Troponin I (High Sensitivity) 3 <18 ng/L   CT Angio Chest PE W and/or Wo Contrast  Result Date: 05/16/2020 CLINICAL DATA:  Left chest pain with cough for a week. EXAM: CT ANGIOGRAPHY CHEST WITH CONTRAST TECHNIQUE: Multidetector CT imaging of the chest was performed using the standard protocol during bolus administration of intravenous contrast. Multiplanar CT image reconstructions and MIPs were obtained to evaluate the vascular  anatomy. CONTRAST:  OMNIPAQUE IOHEXOL 350 MG/ML SOLN COMPARISON:  None. FINDINGS: Cardiovascular: Good opacification of the central and segmental pulmonary arteries. No focal filling defects. No evidence of significant pulmonary embolus. Gas is demonstrated in the pulmonary arteries and right heart, likely arising from intravenous injections. Normal heart size. No pericardial effusions. Normal caliber thoracic aorta. No evidence of aortic dissection. Mediastinum/Nodes: No enlarged mediastinal, hilar, or axillary lymph nodes. Thyroid gland, trachea, and esophagus demonstrate no significant findings. Lungs/Pleura: Lungs are clear. No pleural effusion or pneumothorax. Upper Abdomen: No acute abnormality. Musculoskeletal: No chest wall abnormality. No acute or significant osseous findings. Review of the MIP images confirms the above findings. IMPRESSION: 1. No evidence of significant pulmonary embolus. 2. No evidence of active pulmonary disease. 3. Gas is demonstrated in the pulmonary arteries and right heart, likely arising from intravenous injections. Electronically Signed   By: Burman Nieves M.D.   On: 05/16/2020 05:03   DG Chest Portable 1 View  Result Date: 05/16/2020 CLINICAL DATA:  Chest pain and cough EXAM: PORTABLE CHEST 1 VIEW COMPARISON:  06/21/2019 FINDINGS: The heart size and mediastinal contours are within normal limits. Both lungs are clear. The visualized skeletal structures are unremarkable. IMPRESSION: No active disease. Electronically Signed   By: Deatra Robinson M.D.   On: 05/16/2020 03:01    EKG  EKG Interpretation  Date/Time:  Monday May 16 2020 01:51:25 EDT Ventricular Rate:  71 PR Interval:  136 QRS Duration: 72 QT Interval:  384 QTC Calculation: 417 R Axis:   51 Text Interpretation: Normal sinus rhythm Confirmed by Nicanor Alcon, Liliahna Cudd ([id number]) on 05/16/2020 5:13:37  AM       Radiology DG Chest Portable 1 View  Result Date: 05/16/2020 CLINICAL DATA:  Chest pain and  cough EXAM: PORTABLE CHEST 1 VIEW COMPARISON:  06/21/2019 FINDINGS: The heart size and mediastinal contours are within normal limits. Both lungs are clear. The visualized skeletal structures are unremarkable. IMPRESSION: No active disease. Electronically Signed   By: Deatra RobinsonKevin  Herman M.D.   On: 05/16/2020 03:01    Procedures Procedures (including critical care time)  Medications Ordered in ED Medications  ketorolac (TORADOL) 30 MG/ML injection 30 mg (30 mg Intravenous Given 05/16/20 0353)  iohexol (OMNIPAQUE) 350 MG/ML injection 100 mL (100 mLs Intravenous Contrast Given 05/16/20 0447)    ED Course  I have reviewed the triage vital signs and the nursing notes.  Pertinent labs & imaging results that were available during my care of the patient were reviewed by me and considered in my medical decision making (see chart for details).   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     510 case d/w Dr. Andria MeuseStevens of radiology.  Air can be seen post IV placement when injecting medications and IV dye.  This is not significant, nor life threatening. It is also not related to previous covid.      Ruled out for MI (only one troponin necessary giving ongoing pain for one week) and PE in the ED.  Heart score is 1 very low risk for MACE. The symptoms are consistent with MSK pain.  Will start NSAIDs.    Sherry Clark was evaluated in Emergency Department on 05/16/2020 for the symptoms described in the history of present illness. She was evaluated in the context of the global COVID-19 pandemic, which necessitated consideration that the patient might be at risk for infection with the SARS-CoV-2 virus that causes COVID-19. Institutional protocols and algorithms that pertain to the evaluation of patients at risk for COVID-19 are in a state of rapid change based on information released by regulatory bodies including the CDC and federal and state organizations. These policies and algorithms were followed  during the patient's care in the ED.  Final Clinical Impression(s) / ED Diagnoses Return for intractable cough, coughing up blood,fevers >100.4 unrelieved by medication, shortness of breath, intractable vomiting, chest pain, shortness of breath, weakness,numbness, changes in speech, facial asymmetry,abdominal pain, passing out,Inability to tolerate liquids or food, cough, altered mental status or any concerns. No signs of systemic illness or infection. The patient is nontoxic-appearing on exam and vital signs are within normal limits.   I have reviewed the triage vital signs and the nursing notes. Pertinent labs &imaging results that were available during my care of the patient were reviewed by me and considered in my medical decision making (see chart for details).After history, exam, and medical workup I feel the patient has beenappropriately medically screened and is safe for discharge home. Pertinent diagnoses were discussed with the patient. Patient was given return precautions.   Anthony Tamburo, MD 05/16/20 [phone]

## 2022-05-11 ENCOUNTER — Other Ambulatory Visit: Payer: Self-pay

## 2022-05-11 ENCOUNTER — Emergency Department (HOSPITAL_BASED_OUTPATIENT_CLINIC_OR_DEPARTMENT_OTHER)
Admission: EM | Admit: 2022-05-11 | Discharge: 2022-05-11 | Disposition: A | Discharge status: Home / Self Care | Payer: Medicaid Other | Attending: Emergency Medicine | Admitting: Emergency Medicine

## 2022-05-11 ENCOUNTER — Emergency Department (HOSPITAL_BASED_OUTPATIENT_CLINIC_OR_DEPARTMENT_OTHER): Payer: Medicaid Other

## 2022-05-11 ENCOUNTER — Encounter (HOSPITAL_BASED_OUTPATIENT_CLINIC_OR_DEPARTMENT_OTHER): Payer: Self-pay | Admitting: Emergency Medicine

## 2022-05-11 DIAGNOSIS — I10 Essential (primary) hypertension: Secondary | ICD-10-CM | POA: Insufficient documentation

## 2022-05-11 DIAGNOSIS — Z79899 Other long term (current) drug therapy: Secondary | ICD-10-CM | POA: Insufficient documentation

## 2022-05-11 DIAGNOSIS — F17200 Nicotine dependence, unspecified, uncomplicated: Secondary | ICD-10-CM | POA: Insufficient documentation

## 2022-05-11 DIAGNOSIS — R600 Localized edema: Secondary | ICD-10-CM | POA: Insufficient documentation

## 2022-05-11 LAB — COMPREHENSIVE METABOLIC PANEL
ALT: 16 U/L (ref 0–44)
AST: 22 U/L (ref 15–41)
Albumin: 3.6 g/dL (ref 3.5–5.0)
Alkaline Phosphatase: 50 U/L (ref 38–126)
Anion gap: 8 (ref 5–15)
BUN: 7 mg/dL (ref 6–20)
CO2: 24 mmol/L (ref 22–32)
Calcium: 8.7 mg/dL — ABNORMAL LOW (ref 8.9–10.3)
Chloride: 106 mmol/L (ref 98–111)
Creatinine, Ser: 0.94 mg/dL (ref 0.44–1.00)
GFR, Estimated: >60 mL/min (ref >60)
Glucose, Bld: 151 mg/dL — ABNORMAL HIGH (ref 70–99)
Potassium: 3.6 mmol/L (ref 3.5–5.1)
Sodium: 138 mmol/L (ref 135–145)
Total Bilirubin: 0.4 mg/dL (ref 0.3–1.2)
Total Protein: 7.2 g/dL (ref 6.5–8.1)

## 2022-05-11 LAB — CBC WITH DIFFERENTIAL/PLATELET
Abs Immature Granulocytes: 0.02 10*3/uL (ref 0.00–0.07)
Basophils Absolute: 0 10*3/uL (ref 0.0–0.1)
Basophils Relative: 1 %
Eosinophils Absolute: 0.2 10*3/uL (ref 0.0–0.5)
Eosinophils Relative: 3 %
HCT: 30 % — ABNORMAL LOW (ref 36.0–46.0)
Hemoglobin: 9.8 g/dL — ABNORMAL LOW (ref 12.0–15.0)
Immature Granulocytes: 0 %
Lymphocytes Relative: 44 %
Lymphs Abs: 3 10*3/uL (ref 0.7–4.0)
MCH: 26.8 pg (ref 26.0–34.0)
MCHC: 32.7 g/dL (ref 30.0–36.0)
MCV: 82.2 fL (ref 80.0–100.0)
Monocytes Absolute: 0.6 10*3/uL (ref 0.1–1.0)
Monocytes Relative: 9 %
Neutro Abs: 2.8 10*3/uL (ref 1.7–7.7)
Neutrophils Relative %: 43 %
Platelets: 400 10*3/uL (ref 150–400)
RBC: 3.65 MIL/uL — ABNORMAL LOW (ref 3.87–5.11)
RDW: 13.5 % (ref 11.5–15.5)
WBC: 6.6 10*3/uL (ref 4.0–10.5)
nRBC: 0 % (ref 0.0–0.2)

## 2022-05-11 NOTE — Discharge Instructions (Addendum)
Get over the counter compression stockings to help your leg swelling. When you are resting, be sure to elevate your legs.   Your blood pressure was elevated today.  Get this rechecked at the health department to make sure you do not have hypertension as you may need medicines to help treat this.  If you develop fever, new or worsening swelling, chest pain, shortness of breath, or any other new/concerning symptoms then return to the ER for evaluation.

## 2022-05-11 NOTE — ED Provider Notes (Signed)
[facility] EMERGENCY DEPARTMENT Provider Note   CSN: [id number] Arrival date & time: 05/11/22  1827     History  Chief Complaint  Patient presents with   Leg Swelling    Sherry Clark is a 51 y.o. female.  HPI 51 year old female presents with bilateral leg swelling.  Ongoing for 6 days.  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  There is some tightness and discomfort.  Right leg seems more swollen than the left.  No chest pain, shortness of breath, orthopnea/PND.  Seems to be worst while she is driving and having her legs hanging down.  Patient denies any significant past medical history such as hypertension though she does have a current history of smoking.  Home Medications Prior to Admission medications   Medication Sig Start Date End Date Taking? Authorizing Provider  albuterol (PROVENTIL HFA;VENTOLIN HFA) 108 (90 Base) MCG/ACT inhaler Inhale 1-2 puffs into the lungs every 6 (six) hours as needed for wheezing or shortness of breath. 11/26/15   Palumbo, April, MD  amoxicillin (AMOXIL) 500 MG capsule Take 1 capsule (500 mg total) by mouth 3 (three) times daily. 08/22/19   Eber Hong, MD  fluticasone (FLONASE) 50 MCG/ACT nasal spray Place 2 sprays into both nostrils daily. 11/26/15   Palumbo, April, MD  hydrochlorothiazide (HYDRODIURIL) 25 MG tablet Take 1 tablet (25 mg total) by mouth daily. 04/16/16   Geoffery Lyons, MD  megestrol (MEGACE) 40 MG tablet Take 1 tablet (40 mg total) by mouth 2 (two) times daily. Take twice daily until bleeding stops - consult with your Gynecologist before starting this medicine 04/09/20   Little, Ambrose Finland, MD  meloxicam (MOBIC) 7.5 MG tablet Take 1 tablet (7.5 mg total) by mouth daily. 05/16/20   Palumbo, April, MD  metroNIDAZOLE (FLAGYL) 500 MG tablet Take 1 tablet (500 mg total) by mouth 2 (two) times daily. 04/09/20   Little, Ambrose Finland, MD  naproxen (NAPROSYN) 500 MG tablet Take 1 tablet (500 mg total) by mouth 2  (two) times daily with a meal. 08/22/19   Eber Hong, MD  omeprazole (PRILOSEC) 40 MG capsule Take 1 capsule (40 mg total) by mouth daily as needed (acid reflux, burning abdominal pain). 04/09/16   Ward, Layla Maw, DO  ondansetron (ZOFRAN ODT) 4 MG disintegrating tablet Take 1 tablet (4 mg total) by mouth every 8 (eight) hours as needed for nausea or vomiting. 10/05/17   Pricilla Loveless, MD  ondansetron (ZOFRAN ODT) 4 MG disintegrating tablet Take 1 tablet (4 mg total) by mouth every 4 (four) hours as needed for nausea or vomiting. 12/30/19   Arby Barrette, MD      Allergies    Patient has no known allergies.    Review of Systems   Review of Systems  Respiratory:  Negative for shortness of breath.   Cardiovascular:  Positive for leg swelling. Negative for chest pain.  Neurological:  Negative for weakness and numbness.    Physical Exam Updated Vital Signs BP (!) 143/91   Pulse 81   Temp 98 F (36.7 C)   Resp 18   Ht 5\' 3"  (1.6 m)   Wt 90.7 kg   SpO2 100%   BMI 35.43 kg/m  Physical Exam Vitals and nursing note reviewed.  Constitutional:      Appearance: She is well-developed.  HENT:     Head: Normocephalic and atraumatic.  Cardiovascular:     Rate and Rhythm: Normal rate and regular rhythm.  Pulses:          Dorsalis pedis pulses are 2+ on the right side and 2+ on the left side.     Heart sounds: Normal heart sounds.  Pulmonary:     Effort: Pulmonary effort is normal.     Breath sounds: Normal breath sounds. No wheezing or rales.  Abdominal:     General: There is no distension.  Musculoskeletal:     Comments: Patient's right foot has some minimal dorsal swelling. No calf tenderness bilaterally. No erythema/cellulitis. No thigh tenderness  Skin:    General: Skin is warm and dry.     Findings: No erythema.  Neurological:     Mental Status: She is alert.     ED Results / Procedures / Treatments   Labs (all labs ordered are listed, but only abnormal results are  displayed) Labs Reviewed  COMPREHENSIVE METABOLIC PANEL - Abnormal; Notable for the following components:      Result Value   Glucose, Bld 151 (*)    Calcium 8.7 (*)    All other components within normal limits  CBC WITH DIFFERENTIAL/PLATELET - Abnormal; Notable for the following components:   RBC 3.65 (*)    Hemoglobin 9.8 (*)    HCT 30.0 (*)    All other components within normal limits    EKG None  Radiology US Venous Img Lower Bilateral (DVT)  Result Date: 05/11/2022 CLINICAL DATA:  Lower extremity swelling. EXAM: BILATERAL LOWER EXTREMITY VENOUS DOPPLER ULTRASOUND TECHNIQUE: Gray-scale sonography with compression, as well as color and duplex ultrasound, were performed to evaluate the deep venous system(s) from the level of the common femoral vein through the popliteal and proximal calf veins. COMPARISON:  None Available. FINDINGS: VENOUS Normal compressibility of the common femoral, superficial femoral, and popliteal veins, as well as the visualized calf veins. The right peroneal vein is not seen. Visualized portions of profunda femoral vein and great saphenous vein unremarkable. No filling defects to suggest DVT on grayscale or color Doppler imaging. Doppler waveforms show normal direction of venous flow, normal respiratory plasticity and response to augmentation. OTHER Mild subcutaneous edema noted in the calfs. Limitations: none IMPRESSION: No evidence of bilateral lower extremity DVT. Electronically Signed   By: Narda Rutherford M.D.   On: 05/11/2022 19:31    Procedures Procedures    Medications Ordered in ED Medications - No data to display  ED Course/ Medical Decision Making/ A&P                           Medical Decision Making Amount and/or Complexity of Data Reviewed Labs: ordered.   DVT ultrasound ordered in triage.  Patient's legs are minimally swollen on my exam and are neurovascular intact.  DVT ultrasound is negative.  I personally viewed/interpreted these  images.  Labs were obtained given she is hypertensive and has a prior history of hypertension though she states she was taken off hypertension meds.  Kidneys and liver seem okay.  Mild anemia that is a little worse than a couple years ago but she states she has heavy menstrual cycles and is currently on her menstrual cycle.  Her blood pressure has trended down since being in the ED and is more normal.  At this point, I think she can follow-up with the health department who is her PCP.  Recommended compression stockings.  I doubt acute emergent condition and think she is stable for discharge home.  Final Clinical Impression(s) / ED Diagnoses Final diagnoses:  Bilateral lower extremity edema    Rx / DC Orders ED Discharge Orders     None         Pricilla Loveless, MD 05/11/22 2130

## 2022-05-11 NOTE — ED Triage Notes (Signed)
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 States when she is driving her right leg has been swelling.

## 2024-04-26 ENCOUNTER — Emergency Department (HOSPITAL_BASED_OUTPATIENT_CLINIC_OR_DEPARTMENT_OTHER)
Admission: EM | Admit: 2024-04-26 | Discharge: 2024-04-26 | Disposition: A | Discharge status: Home / Self Care | Payer: Self-pay | Attending: Emergency Medicine | Admitting: Emergency Medicine

## 2024-04-26 ENCOUNTER — Emergency Department (HOSPITAL_BASED_OUTPATIENT_CLINIC_OR_DEPARTMENT_OTHER): Payer: Self-pay

## 2024-04-26 ENCOUNTER — Other Ambulatory Visit: Payer: Self-pay

## 2024-04-26 ENCOUNTER — Encounter (HOSPITAL_BASED_OUTPATIENT_CLINIC_OR_DEPARTMENT_OTHER): Payer: Self-pay | Admitting: Emergency Medicine

## 2024-04-26 DIAGNOSIS — K5900 Constipation, unspecified: Secondary | ICD-10-CM | POA: Insufficient documentation

## 2024-04-26 DIAGNOSIS — R1011 Right upper quadrant pain: Secondary | ICD-10-CM

## 2024-04-26 LAB — COMPREHENSIVE METABOLIC PANEL WITH GFR
ALT: 20 U/L (ref 0–44)
AST: 22 U/L (ref 15–41)
Albumin: 4 g/dL (ref 3.5–5.0)
Alkaline Phosphatase: 62 U/L (ref 38–126)
Anion gap: 14 (ref 5–15)
BUN: 13 mg/dL (ref 6–20)
CO2: 21 mmol/L — ABNORMAL LOW (ref 22–32)
Calcium: 9.1 mg/dL (ref 8.9–10.3)
Chloride: 104 mmol/L (ref 98–111)
Creatinine, Ser: 0.86 mg/dL (ref 0.44–1.00)
GFR, Estimated: >60 mL/min (ref >60)
Glucose, Bld: 111 mg/dL — ABNORMAL HIGH (ref 70–99)
Potassium: 3.9 mmol/L (ref 3.5–5.1)
Sodium: 139 mmol/L (ref 135–145)
Total Bilirubin: 0.3 mg/dL (ref 0.0–1.2)
Total Protein: 7.1 g/dL (ref 6.5–8.1)

## 2024-04-26 LAB — LIPASE, BLOOD: Lipase: 36 U/L (ref 11–51)

## 2024-04-26 LAB — TROPONIN T, HIGH SENSITIVITY
Troponin T High Sensitivity: <15 ng/L (ref 0–19)
Troponin T High Sensitivity: <15 ng/L (ref 0–19)

## 2024-04-26 LAB — CBC
HCT: 36.8 % (ref 36.0–46.0)
Hemoglobin: 12.8 g/dL (ref 12.0–15.0)
MCH: 30.3 pg (ref 26.0–34.0)
MCHC: 34.8 g/dL (ref 30.0–36.0)
MCV: 87 fL (ref 80.0–100.0)
Platelets: 334 K/uL (ref 150–400)
RBC: 4.23 MIL/uL (ref 3.87–5.11)
RDW: 12 % (ref 11.5–15.5)
WBC: 6.2 K/uL (ref 4.0–10.5)
nRBC: 0 % (ref 0.0–0.2)

## 2024-04-26 LAB — D-DIMER, QUANTITATIVE: D-Dimer, Quant: 0.3 ug{FEU}/mL (ref 0.00–0.50)

## 2024-04-26 LAB — HCG, SERUM, QUALITATIVE: Preg, Serum: NEGATIVE

## 2024-04-26 MED ORDER — FLEET ENEMA RE ENEM
1.0000 | ENEMA | Freq: Once | RECTAL | Status: AC
  Administered 2024-04-26: 1 via RECTAL
  Filled 2024-04-26: qty 1

## 2024-04-26 MED ORDER — ONDANSETRON HCL 4 MG/2ML IJ SOLN
4.0000 mg | Freq: Once | INTRAMUSCULAR | Status: AC
  Administered 2024-04-26: 4 mg via INTRAVENOUS
  Filled 2024-04-26: qty 2

## 2024-04-26 MED ORDER — IOHEXOL 300 MG/ML  SOLN
100.0000 mL | Freq: Once | INTRAMUSCULAR | Status: AC | PRN
  Administered 2024-04-26: 100 mL via INTRAVENOUS

## 2024-04-26 MED ORDER — HYDROMORPHONE HCL 1 MG/ML IJ SOLN
1.0000 mg | Freq: Once | INTRAMUSCULAR | Status: AC
  Administered 2024-04-26: 1 mg via INTRAVENOUS
  Filled 2024-04-26: qty 1

## 2024-04-26 NOTE — ED Provider Notes (Signed)
 [facility] Provider Note   CSN: [id number] Arrival date & time: 04/26/24  0302     Patient presents with: Abdominal Pain   Sherry Clark is a 53 y.o. female.   The history is provided by the patient and medical records.  Abdominal Pain Sherry Clark is a 53 y.o. female who presents to the Emergency Department complaining of abdominal pain. She presents the emergency department for evaluation of epigastric/right upper quadrant abdominal pain that radiates to the right flank. Pain started last night. It is severe in nature and waxes and wanes. No associated fever, vomiting, leg swelling, difficulty breathing. She does breathe faster when she has the pain because it makes her so uncomfortable. No prior similar symptoms. No known medical problems. No history of DVT/PE. No bowel movement since Tuesday but she does not feel like she needs to have a bowel movement.   Prior to Admission medications   Medication Sig Start Date End Date Taking? Authorizing Provider  albuterol  (PROVENTIL  HFA;VENTOLIN  HFA) 108 (90 Base) MCG/ACT inhaler Inhale 1-2 puffs into the lungs every 6 (six) hours as needed for wheezing or shortness of breath. 11/26/15   Palumbo, April, MD  amoxicillin  (AMOXIL ) 500 MG capsule Take 1 capsule (500 mg total) by mouth 3 (three) times daily. 08/22/19   Cleotilde Rogue, MD  fluticasone  (FLONASE ) 50 MCG/ACT nasal spray Place 2 sprays into both nostrils daily. 11/26/15   Palumbo, April, MD  hydrochlorothiazide  (HYDRODIURIL ) 25 MG tablet Take 1 tablet (25 mg total) by mouth daily. 04/16/16   Geroldine Berg, MD  megestrol  (MEGACE ) 40 MG tablet Take 1 tablet (40 mg total) by mouth 2 (two) times daily. Take twice daily until bleeding stops - consult with your Gynecologist before starting this medicine 04/09/20   Little, Vernell Search, MD  meloxicam  (MOBIC ) 7.5 MG tablet Take 1 tablet (7.5 mg total) by mouth daily. 05/16/20   Palumbo, April, MD   metroNIDAZOLE  (FLAGYL ) 500 MG tablet Take 1 tablet (500 mg total) by mouth 2 (two) times daily. 04/09/20   Little, Vernell Search, MD  naproxen  (NAPROSYN ) 500 MG tablet Take 1 tablet (500 mg total) by mouth 2 (two) times daily with a meal. 08/22/19   Cleotilde Rogue, MD  omeprazole  (PRILOSEC) 40 MG capsule Take 1 capsule (40 mg total) by mouth daily as needed (acid reflux, burning abdominal pain). 04/09/16   Ward, Josette SAILOR, DO  ondansetron  (ZOFRAN  ODT) 4 MG disintegrating tablet Take 1 tablet (4 mg total) by mouth every 8 (eight) hours as needed for nausea or vomiting. 10/05/17   Freddi Hamilton, MD  ondansetron  (ZOFRAN  ODT) 4 MG disintegrating tablet Take 1 tablet (4 mg total) by mouth every 4 (four) hours as needed for nausea or vomiting. 12/30/19   Armenta Canning, MD    Allergies: Patient has no known allergies.    Review of Systems  Gastrointestinal:  Positive for abdominal pain.  All other systems reviewed and are negative.   Updated Vital Signs BP (!) 154/84   Pulse (!) 58   Temp 97.8 F (36.6 C) (Oral)   Resp 14   Ht 5' 3 (1.6 m)   Wt 95.3 kg   SpO2 93%   BMI 37.20 kg/m   Physical Exam Vitals and nursing note reviewed.  Constitutional:      Appearance: She is well-developed.  HENT:     Head: Normocephalic and atraumatic.  Cardiovascular:     Rate and Rhythm: Normal rate and regular  rhythm.  Pulmonary:     Effort: Pulmonary effort is normal. No respiratory distress.  Abdominal:     Palpations: Abdomen is soft.     Tenderness: There is no abdominal tenderness. There is no guarding or rebound.  Musculoskeletal:        General: No tenderness.  Skin:    General: Skin is warm and dry.  Neurological:     Mental Status: She is alert and oriented to person, place, and time.  Psychiatric:        Behavior: Behavior normal.     (all labs ordered are listed, but only abnormal results are displayed) Labs Reviewed  COMPREHENSIVE METABOLIC PANEL WITH GFR - Abnormal; Notable  for the following components:      Result Value   CO2 21 (*)    Glucose, Bld 111 (*)    All other components within normal limits  LIPASE, BLOOD  CBC  D-DIMER, QUANTITATIVE  HCG, SERUM, QUALITATIVE  URINALYSIS, ROUTINE W REFLEX MICROSCOPIC  TROPONIN T, HIGH SENSITIVITY  TROPONIN T, HIGH SENSITIVITY    EKG: EKG Interpretation Date/Time:  Sunday April 26 2024 03:54:00 EDT Ventricular Rate:  68 PR Interval:  149 QRS Duration:  89 QT Interval:  386 QTC Calculation: 411 R Axis:   44  Text Interpretation: Sinus rhythm Borderline abnrm T, anterolateral leads Confirmed by Griselda Norris [phone]) on 04/26/2024 4:00:13 AM  Radiology: CT ABDOMEN PELVIS W CONTRAST Result Date: 04/26/2024 EXAM: CT ABDOMEN AND PELVIS WITH CONTRAST 04/26/2024 06:30:00 AM TECHNIQUE: CT of the abdomen and pelvis was performed with the administration of intravenous contrast. Multiplanar reformatted images are provided for review. Automated exposure control, iterative reconstruction, and/or weight based adjustment of the mA/kV was utilized to reduce the radiation dose to as low as reasonably achievable. CONTRAST: 100mL iohexol  (OMNIPAQUE ) 300 MG/ML solution COMPARISON: None available. CLINICAL HISTORY: Abdominal/flank pain, stone suspected; RLQ abdominal pain. Pt reports intermittent epigastric/RUQ pain that started Wednesday. She states Friday it seemed to have stopped, but started again last night (Saturday). She describes it as a burning pain, like a hunger pain, states at times she feels it radiate to her back, and it has made her nauseated. Denies vomiting, fever. She states she has had pain like this before and it was acid reflux. Has not taken anything OTC for her pain. LBM Tuesday. She reports having taken ex-lax with no result. States it is uncommon for her to go more than 1 day without having a BM. +flatus FINDINGS: LOWER CHEST: Mild, peripheral areas of ground glass attenuation within the lung bases which  favored to represent dependent change. No pleural fluid or consolidative change. LIVER: The liver is unremarkable. GALLBLADDER AND BILE DUCTS: Gallbladder is unremarkable. No biliary ductal dilatation. SPLEEN: No acute abnormality. PANCREAS: No acute abnormality. ADRENAL GLANDS: No acute abnormality. KIDNEYS, URETERS AND BLADDER: No stones in the kidneys or ureters. No hydronephrosis. No perinephric or periureteral stranding. Urinary bladder is unremarkable. GI AND BOWEL: Small hiatal hernia. The appendix is visualized and appears normal. Left-sided colonic diverticulosis without signs of acute diverticulitis. Moderate retained stool noted within the cecum and ascending colon. No bowel obstruction. No bowel wall thickening. PERITONEUM AND RETROPERITONEUM: No free fluid or fluid collections. No signs of pneumoperitoneum. VASCULATURE: Aortic atherosclerotic calcification. LYMPH NODES: No lymphadenopathy. REPRODUCTIVE ORGANS: No acute abnormality. BONES AND SOFT TISSUES: No acute osseous abnormality. No focal soft tissue abnormality. IMPRESSION: 1. No acute findings in the abdomen or pelvis. 2. Moderate retained stool in the cecum and ascending colon.  Correlate for any signs or symptoms of constipation. 3. Left-sided colonic diverticulosis without signs of acute diverticulitis. Electronically signed by: Waddell Calk MD 04/26/2024 06:45 AM EDT RP Workstation: [id number]     Procedures   Medications Ordered in the ED  sodium phosphate (FLEET) enema 1 enema (has no administration in time range)  HYDROmorphone  (DILAUDID ) injection 1 mg (1 mg Intravenous Given 04/26/24 0456)  ondansetron  (ZOFRAN ) injection 4 mg (4 mg Intravenous Given 04/26/24 0455)  iohexol  (OMNIPAQUE ) 300 MG/ML solution 100 mL (100 mLs Intravenous Contrast Given 04/26/24 0630)                                    Medical Decision Making Amount and/or Complexity of Data Reviewed Labs: ordered. Radiology: ordered.  Risk OTC  drugs. Prescription drug management.   Patient here for evaluation of right sided abdominal pain. No appreciable tenderness on examination although she is uncomfortable appearing. Labs are unremarkable. CT abdomen pelvis was obtained, which is negative for acute appendicitis or diverticulitis. CT is significant for constipation. Suspect this may be contributing to her symptoms. After pain medications for pain has significantly improved. Discussed with patient home care for constipation. Discussed outpatient follow-up as well as return precautions.     Final diagnoses:  Right upper quadrant abdominal pain  Constipation, unspecified constipation type    ED Discharge Orders     None          Griselda Norris, MD 04/26/24 [phone]

## 2024-04-26 NOTE — Discharge Instructions (Addendum)
 You can take miralax and colace, available over the counter for constipation.

## 2024-04-26 NOTE — ED Triage Notes (Signed)
 Pt reports intermittent epigastric/RUQ pain that started Wednesday. She states Friday it seemed to have stopped, but started again last night (Saturday). She describes it as a burning pain, like a hunger pain, states at times she feels it radiate to her back, and it has made her nauseated. Denies vomiting, fever. She states she has had pain like this before and it was acid reflux. Has not taken anything OTC for her pain. LBM Tuesday. She reports having taken ex-lax with no result. States it is uncommon for her to go more than 1 day without having a BM. +flatus

## 2024-06-01 ENCOUNTER — Emergency Department (HOSPITAL_BASED_OUTPATIENT_CLINIC_OR_DEPARTMENT_OTHER): Payer: PRIVATE HEALTH INSURANCE

## 2024-06-01 ENCOUNTER — Encounter (HOSPITAL_BASED_OUTPATIENT_CLINIC_OR_DEPARTMENT_OTHER): Payer: Self-pay

## 2024-06-01 ENCOUNTER — Emergency Department (HOSPITAL_BASED_OUTPATIENT_CLINIC_OR_DEPARTMENT_OTHER)
Admission: EM | Admit: 2024-06-01 | Discharge: 2024-06-01 | Disposition: A | Discharge status: Home / Self Care | Payer: PRIVATE HEALTH INSURANCE | Attending: Emergency Medicine | Admitting: Emergency Medicine

## 2024-06-01 ENCOUNTER — Other Ambulatory Visit: Payer: Self-pay

## 2024-06-01 DIAGNOSIS — Z79899 Other long term (current) drug therapy: Secondary | ICD-10-CM | POA: Insufficient documentation

## 2024-06-01 DIAGNOSIS — K219 Gastro-esophageal reflux disease without esophagitis: Secondary | ICD-10-CM | POA: Diagnosis not present

## 2024-06-01 DIAGNOSIS — R072 Precordial pain: Secondary | ICD-10-CM

## 2024-06-01 DIAGNOSIS — I10 Essential (primary) hypertension: Secondary | ICD-10-CM | POA: Insufficient documentation

## 2024-06-01 LAB — TROPONIN T, HIGH SENSITIVITY
Troponin T High Sensitivity: <15 ng/L (ref 0–19)
Troponin T High Sensitivity: <15 ng/L (ref 0–19)

## 2024-06-01 LAB — CBC WITH DIFFERENTIAL/PLATELET
Abs Immature Granulocytes: 0 K/uL (ref 0.00–0.07)
Basophils Absolute: 0 K/uL (ref 0.0–0.1)
Basophils Relative: 0 %
Eosinophils Absolute: 0.1 K/uL (ref 0.0–0.5)
Eosinophils Relative: 2 %
HCT: 36.9 % (ref 36.0–46.0)
Hemoglobin: 12.9 g/dL (ref 12.0–15.0)
Immature Granulocytes: 0 %
Lymphocytes Relative: 61 %
Lymphs Abs: 4.3 K/uL — ABNORMAL HIGH (ref 0.7–4.0)
MCH: 30.2 pg (ref 26.0–34.0)
MCHC: 35 g/dL (ref 30.0–36.0)
MCV: 86.4 fL (ref 80.0–100.0)
Monocytes Absolute: 0.6 K/uL (ref 0.1–1.0)
Monocytes Relative: 9 %
Neutro Abs: 1.9 K/uL (ref 1.7–7.7)
Neutrophils Relative %: 28 %
Platelets: 339 K/uL (ref 150–400)
RBC: 4.27 MIL/uL (ref 3.87–5.11)
RDW: 12.2 % (ref 11.5–15.5)
WBC: 7 K/uL (ref 4.0–10.5)
nRBC: 0 % (ref 0.0–0.2)

## 2024-06-01 LAB — BASIC METABOLIC PANEL WITH GFR
Anion gap: 12 (ref 5–15)
BUN: 10 mg/dL (ref 6–20)
CO2: 25 mmol/L (ref 22–32)
Calcium: 9.2 mg/dL (ref 8.9–10.3)
Chloride: 105 mmol/L (ref 98–111)
Creatinine, Ser: 0.78 mg/dL (ref 0.44–1.00)
GFR, Estimated: >60 mL/min (ref >60)
Glucose, Bld: 114 mg/dL — ABNORMAL HIGH (ref 70–99)
Potassium: 3.9 mmol/L (ref 3.5–5.1)
Sodium: 142 mmol/L (ref 135–145)

## 2024-06-01 LAB — HCG, SERUM, QUALITATIVE: Preg, Serum: NEGATIVE

## 2024-06-01 MED ORDER — ALUM & MAG HYDROXIDE-SIMETH 200-200-20 MG/5ML PO SUSP
30.0000 mL | Freq: Once | ORAL | Status: AC
  Administered 2024-06-01: 30 mL via ORAL
  Filled 2024-06-01: qty 30

## 2024-06-01 MED ORDER — SODIUM CHLORIDE 0.9 % IV SOLN
INTRAVENOUS | Status: DC | PRN
Start: 2024-06-01 — End: 2024-06-01
  Administered 2024-06-01: 250 mL via INTRAVENOUS

## 2024-06-01 MED ORDER — HALOPERIDOL LACTATE 5 MG/ML IJ SOLN
2.0000 mg | Freq: Once | INTRAMUSCULAR | Status: AC
  Administered 2024-06-01: 2 mg via INTRAVENOUS
  Filled 2024-06-01: qty 1

## 2024-06-01 MED ORDER — FENTANYL CITRATE PF 50 MCG/ML IJ SOSY
50.0000 ug | PREFILLED_SYRINGE | Freq: Once | INTRAMUSCULAR | Status: AC
  Administered 2024-06-01: 50 ug via INTRAVENOUS
  Filled 2024-06-01: qty 1

## 2024-06-01 MED ORDER — DICYCLOMINE HCL 10 MG PO CAPS
10.0000 mg | ORAL_CAPSULE | Freq: Once | ORAL | Status: AC
  Administered 2024-06-01: 10 mg via ORAL
  Filled 2024-06-01: qty 1

## 2024-06-01 MED ORDER — AMLODIPINE BESYLATE 5 MG PO TABS
5.0000 mg | ORAL_TABLET | Freq: Once | ORAL | Status: AC
  Administered 2024-06-01: 5 mg via ORAL
  Filled 2024-06-01: qty 1

## 2024-06-01 MED ORDER — AMLODIPINE BESYLATE 5 MG PO TABS: 5.0000 mg | ORAL_TABLET | Freq: Every day | ORAL | 0 refills | Status: AC

## 2024-06-01 MED ORDER — HYDRALAZINE HCL 20 MG/ML IJ SOLN
5.0000 mg | Freq: Once | INTRAMUSCULAR | Status: AC
  Administered 2024-06-01: 5 mg via INTRAVENOUS
  Filled 2024-06-01: qty 1

## 2024-06-01 MED ORDER — OMEPRAZOLE 40 MG PO CPDR: 40.0000 mg | DELAYED_RELEASE_CAPSULE | Freq: Every day | ORAL | 0 refills | Status: AC

## 2024-06-01 MED ORDER — KETOROLAC TROMETHAMINE 30 MG/ML IJ SOLN
30.0000 mg | Freq: Once | INTRAMUSCULAR | Status: AC
  Administered 2024-06-01: 30 mg via INTRAVENOUS
  Filled 2024-06-01: qty 1

## 2024-06-01 MED ORDER — HYDRALAZINE HCL 20 MG/ML IJ SOLN
2.0000 mg | Freq: Once | INTRAMUSCULAR | Status: AC
  Administered 2024-06-01: 2 mg via INTRAVENOUS
  Filled 2024-06-01: qty 1

## 2024-06-01 MED ORDER — FAMOTIDINE IN NACL 20-0.9 MG/50ML-% IV SOLN
20.0000 mg | Freq: Once | INTRAVENOUS | Status: AC
  Administered 2024-06-01: 20 mg via INTRAVENOUS
  Filled 2024-06-01: qty 50

## 2024-06-01 MED ORDER — ONDANSETRON HCL 4 MG/2ML IJ SOLN
4.0000 mg | Freq: Once | INTRAMUSCULAR | Status: AC
  Administered 2024-06-01: 4 mg via INTRAVENOUS
  Filled 2024-06-01: qty 2

## 2024-06-01 MED ORDER — IOHEXOL 350 MG/ML SOLN
100.0000 mL | Freq: Once | INTRAVENOUS | Status: AC | PRN
  Administered 2024-06-01: 100 mL via INTRAVENOUS

## 2024-06-01 NOTE — ED Provider Notes (Signed)
 [place] [facility] Provider Note   CSN: [id number] Arrival date & time: 06/01/24  9943     Patient presents with: Chest Pain (W/ back pain)   Sherry Clark is a 53 y.o. female.   The history is provided by the patient.  Chest Pain Pain location:  Substernal area Pain quality: burning   Pain radiates to:  Does not radiate Pain severity:  Severe Onset quality:  Sudden Duration: hours. Timing:  Constant Progression:  Unchanged Context: at rest   Context: not eating   Relieved by:  Nothing Worsened by:  Nothing Ineffective treatments:  None tried Associated symptoms: heartburn   Associated symptoms: no claudication, no diaphoresis, no fever, no palpitations, no PND, no shortness of breath, no vomiting and no weakness   Risk factors: not pregnant, no prior DVT/PE and no smoking   Patient with a h/o HTN and GERd presents with sudden onset SSCP that is burning in nature post eating fried fish and seafood and fries.  Patient is not currently on medication for GERD nor blood pressure.  No n/v/d.  No DOE.  No exertional symptoms.      Past Medical History:  Diagnosis Date   Hypertension      Prior to Admission medications   Medication Sig Start Date End Date Taking? Authorizing Provider  amLODipine  (NORVASC ) 5 MG tablet Take 1 tablet (5 mg total) by mouth daily. 06/01/24  Yes Sadrac Zeoli, MD  omeprazole  (PRILOSEC) 40 MG capsule Take 1 capsule (40 mg total) by mouth daily. 06/01/24  Yes Jaloni Sorber, MD  albuterol  (PROVENTIL  HFA;VENTOLIN  HFA) 108 (90 Base) MCG/ACT inhaler Inhale 1-2 puffs into the lungs every 6 (six) hours as needed for wheezing or shortness of breath. 11/26/15   Purvi Ruehl, MD  amoxicillin  (AMOXIL ) 500 MG capsule Take 1 capsule (500 mg total) by mouth 3 (three) times daily. 08/22/19   Cleotilde Rogue, MD  fluticasone  (FLONASE ) 50 MCG/ACT nasal spray Place 2 sprays into both nostrils daily. 11/26/15   Dugan Vanhoesen,  MD  hydrochlorothiazide  (HYDRODIURIL ) 25 MG tablet Take 1 tablet (25 mg total) by mouth daily. 04/16/16   Geroldine Berg, MD  megestrol  (MEGACE ) 40 MG tablet Take 1 tablet (40 mg total) by mouth 2 (two) times daily. Take twice daily until bleeding stops - consult with your Gynecologist before starting this medicine 04/09/20   Little, Vernell Search, MD  meloxicam  (MOBIC ) 7.5 MG tablet Take 1 tablet (7.5 mg total) by mouth daily. 05/16/20   Darionna Banke, MD  metroNIDAZOLE  (FLAGYL ) 500 MG tablet Take 1 tablet (500 mg total) by mouth 2 (two) times daily. 04/09/20   Little, Vernell Search, MD  naproxen  (NAPROSYN ) 500 MG tablet Take 1 tablet (500 mg total) by mouth 2 (two) times daily with a meal. 08/22/19   Cleotilde Rogue, MD  omeprazole  (PRILOSEC) 40 MG capsule Take 1 capsule (40 mg total) by mouth daily as needed (acid reflux, burning abdominal pain). 04/09/16   Ward, Josette SAILOR, DO  ondansetron  (ZOFRAN  ODT) 4 MG disintegrating tablet Take 1 tablet (4 mg total) by mouth every 8 (eight) hours as needed for nausea or vomiting. 10/05/17   Freddi Hamilton, MD  ondansetron  (ZOFRAN  ODT) 4 MG disintegrating tablet Take 1 tablet (4 mg total) by mouth every 4 (four) hours as needed for nausea or vomiting. 12/30/19   Armenta Canning, MD    Allergies: Patient has no known allergies.    Review of Systems  Constitutional:  Negative for  diaphoresis and fever.  Respiratory:  Negative for shortness of breath.   Cardiovascular:  Positive for chest pain. Negative for palpitations, claudication and PND.  Gastrointestinal:  Positive for heartburn. Negative for vomiting.  Neurological:  Negative for weakness.  All other systems reviewed and are negative.   Updated Vital Signs BP (!) 202/107   Pulse 65   Temp 97.8 F (36.6 C) (Oral)   Resp 13   LMP  (LMP Unknown)   SpO2 100%   Physical Exam Vitals and nursing note reviewed. Exam conducted with a chaperone present.  Constitutional:      General: She is not in acute  distress.    Appearance: Normal appearance. She is well-developed.  HENT:     Head: Normocephalic and atraumatic.     Nose: Nose normal.  Eyes:     Pupils: Pupils are equal, round, and reactive to light.  Cardiovascular:     Rate and Rhythm: Normal rate and regular rhythm.     Pulses: Normal pulses.     Heart sounds: Normal heart sounds.  Pulmonary:     Effort: Pulmonary effort is normal. No respiratory distress.     Breath sounds: Normal breath sounds.  Abdominal:     General: Bowel sounds are normal. There is no distension.     Palpations: Abdomen is soft.     Tenderness: There is no abdominal tenderness. There is no guarding or rebound. Negative signs include Murphy's sign and McBurney's sign.  Musculoskeletal:        General: Normal range of motion.     Cervical back: Normal range of motion and neck supple.  Skin:    General: Skin is warm and dry.     Capillary Refill: Capillary refill takes less than 2 seconds.     Findings: No erythema or rash.  Neurological:     General: No focal deficit present.     Mental Status: She is alert and oriented to person, place, and time.     Deep Tendon Reflexes: Reflexes normal.  Psychiatric:        Mood and Affect: Mood normal.        Behavior: Behavior normal.     (all labs ordered are listed, but only abnormal results are displayed) Results for orders placed or performed during the hospital encounter of 06/01/24  CBC with Differential   Collection Time: 06/01/24  1:11 AM  Result Value Ref Range   WBC 7.0 4.0 - 10.5 K/uL   RBC 4.27 3.87 - 5.11 MIL/uL   Hemoglobin 12.9 12.0 - 15.0 g/dL   HCT 63.0 63.9 - 53.9 %   MCV 86.4 80.0 - 100.0 fL   MCH 30.2 26.0 - 34.0 pg   MCHC 35.0 30.0 - 36.0 g/dL   RDW 87.7 88.4 - 84.4 %   Platelets 339 150 - 400 K/uL   nRBC 0.0 0.0 - 0.2 %   Neutrophils Relative % 28 %   Neutro Abs 1.9 1.7 - 7.7 K/uL   Lymphocytes Relative 61 %   Lymphs Abs 4.3 (H) 0.7 - 4.0 K/uL   Monocytes Relative 9 %    Monocytes Absolute 0.6 0.1 - 1.0 K/uL   Eosinophils Relative 2 %   Eosinophils Absolute 0.1 0.0 - 0.5 K/uL   Basophils Relative 0 %   Basophils Absolute 0.0 0.0 - 0.1 K/uL   Immature Granulocytes 0 %   Abs Immature Granulocytes 0.00 0.00 - 0.07 K/uL  Basic metabolic panel   Collection Time: 06/01/24  1:11 AM  Result Value Ref Range   Sodium 142 135 - 145 mmol/L   Potassium 3.9 3.5 - 5.1 mmol/L   Chloride 105 98 - 111 mmol/L   CO2 25 22 - 32 mmol/L   Glucose, Bld 114 (H) 70 - 99 mg/dL   BUN 10 6 - 20 mg/dL   Creatinine, Ser 9.21 0.44 - 1.00 mg/dL   Calcium 9.2 8.9 - 89.6 mg/dL   GFR, Estimated >39 >39 mL/min   Anion gap 12 5 - 15  hCG, serum, qualitative   Collection Time: 06/01/24  1:11 AM  Result Value Ref Range   Preg, Serum NEGATIVE NEGATIVE  Troponin T, High Sensitivity   Collection Time: 06/01/24  1:11 AM  Result Value Ref Range   Troponin T High Sensitivity <15 0 - 19 ng/L  Troponin T, High Sensitivity   Collection Time: 06/01/24  2:47 AM  Result Value Ref Range   Troponin T High Sensitivity <15 0 - 19 ng/L   CT Angio Chest/Abd/Pel for Dissection W and/or Wo Contrast Result Date: 06/01/2024 EXAM: CTA CHEST, ABDOMEN AND PELVIS WITH AND WITHOUT CONTRAST 06/01/2024 02:20:00 AM TECHNIQUE: CTA of the chest was performed with and without the administration of intravenous contrast. CTA of the abdomen and pelvis was performed with and without the administration of intravenous contrast. Multiplanar reformatted images are provided for review. MIP images are provided for review. Automated exposure control, iterative reconstruction, and/or weight based adjustment of the mA/kV was utilized to reduce the radiation dose to as low as reasonably achievable. COMPARISON: CT abdomen / pelvis dated 04/26/2024 and CTA chest dated 05/16/2020. CLINICAL HISTORY: Acute aortic syndrome (AAS) suspected. Patient presents with CP. Onset: 1 hr ago; Tx at home: took pain medication (pt states she is not sure  the name) Not effective.; Endorses Headache/ Nausea, No Emesis; Hx: Acid reflux. FINDINGS: VASCULATURE: AORTA: No evidence of thoracoabdominal aortic aneurysm or dissection. On unenhanced CT, there is no evidence of intramural hematoma. PULMONARY ARTERIES: No pulmonary embolism with the limits of this exam. GREAT VESSELS OF AORTIC ARCH: No acute finding. No dissection. No arterial occlusion or significant stenosis. CELIAC TRUNK: No acute finding. No occlusion or significant stenosis. SUPERIOR MESENTERIC ARTERY: No acute finding. No occlusion or significant stenosis. INFERIOR MESENTERIC ARTERY: No acute finding. No occlusion or significant stenosis. RENAL ARTERIES: No acute finding. No occlusion or significant stenosis. ILIAC ARTERIES: No acute finding. No occlusion or significant stenosis. CHEST: MEDIASTINUM: No mediastinal lymphadenopathy. The heart and pericardium demonstrate no acute abnormality. LUNGS AND PLEURA: The lungs are without acute process. No focal consolidation or pulmonary edema. No evidence of pleural effusion or pneumothorax. THORACIC BONES AND SOFT TISSUES: No acute bone or soft tissue abnormality. ABDOMEN AND PELVIS: LIVER: The liver is unremarkable. GALLBLADDER AND BILE DUCTS: Gallbladder is unremarkable. No biliary ductal dilatation. SPLEEN: The spleen is unremarkable. PANCREAS: The pancreas is unremarkable. ADRENAL GLANDS: Bilateral adrenal glands demonstrate no acute abnormality. KIDNEYS, URETERS AND BLADDER: No stones in the kidneys or ureters. No hydronephrosis. No perinephric or periureteral stranding. Urinary bladder is unremarkable. GI AND BOWEL: Stomach and duodenal sweep demonstrate no acute abnormality. There is no bowel obstruction. No abnormal bowel wall thickening or distension. Mild left colonic diverticulosis, without evidence of diverticulitis. Normal appendix (image 206). REPRODUCTIVE: Uterus and bilateral ovaries are within normal limits. PERITONEUM AND RETROPERITONEUM: No  ascites or free air. LYMPH NODES: No lymphadenopathy. ABDOMINAL BONES AND SOFT TISSUES: No acute abnormality of the bones. No acute soft tissue abnormality. IMPRESSION: 1.  No evidence of thoracoabdominal aortic aneurysm or dissection. 2. Mild left colonic diverticulosis, without evidence of diverticulitis. 3. Otherwise negative CT chest, abdomen, and pelvis. Electronically signed by: Pinkie Pebbles MD 06/01/2024 02:45 AM EDT RP Workstation: [id number]   DG Chest Portable 1 View Result Date: 06/01/2024 EXAM: 1 VIEW XRAY OF THE CHEST 06/01/2024 01:30:00 AM COMPARISON: 05/16/2020 CLINICAL HISTORY: Patient presents with chest pain. Onset: 1 hr ago; Treatment at home: took pain medication (patient states she is not sure the name) not effective; Endorses headache/nausea, no emesis; History: acid reflux. FINDINGS: LUNGS AND PLEURA: Mild bilateral lower lung scarring/atelectasis. No focal pulmonary opacity. No pulmonary edema. No pleural effusion. No pneumothorax. HEART AND MEDIASTINUM: No acute abnormality of the cardiac and mediastinal silhouettes. BONES AND SOFT TISSUES: No acute osseous abnormality. IMPRESSION: 1. No acute abnormalities. Electronically signed by: Pinkie Pebbles MD 06/01/2024 01:33 AM EDT RP Workstation: [id number]    EKG: EKG Interpretation Date/Time:  Monday June 01 2024 01:08:50 EDT Ventricular Rate:  68 PR Interval:  153 QRS Duration:  85 QT Interval:  392 QTC Calculation: 417 R Axis:   56  Text Interpretation: Sinus rhythm Borderline T wave abnormalities no change from multiple previous Confirmed by Nettie, Else Habermann ([id number]) on 06/01/2024 1:11:43 AM  Radiology: CT Angio Chest/Abd/Pel for Dissection W and/or Wo Contrast Result Date: 06/01/2024 EXAM: CTA CHEST, ABDOMEN AND PELVIS WITH AND WITHOUT CONTRAST 06/01/2024 02:20:00 AM TECHNIQUE: CTA of the chest was performed with and without the administration of intravenous contrast. CTA of the abdomen and pelvis was performed  with and without the administration of intravenous contrast. Multiplanar reformatted images are provided for review. MIP images are provided for review. Automated exposure control, iterative reconstruction, and/or weight based adjustment of the mA/kV was utilized to reduce the radiation dose to as low as reasonably achievable. COMPARISON: CT abdomen / pelvis dated 04/26/2024 and CTA chest dated 05/16/2020. CLINICAL HISTORY: Acute aortic syndrome (AAS) suspected. Patient presents with CP. Onset: 1 hr ago; Tx at home: took pain medication (pt states she is not sure the name) Not effective.; Endorses Headache/ Nausea, No Emesis; Hx: Acid reflux. FINDINGS: VASCULATURE: AORTA: No evidence of thoracoabdominal aortic aneurysm or dissection. On unenhanced CT, there is no evidence of intramural hematoma. PULMONARY ARTERIES: No pulmonary embolism with the limits of this exam. GREAT VESSELS OF AORTIC ARCH: No acute finding. No dissection. No arterial occlusion or significant stenosis. CELIAC TRUNK: No acute finding. No occlusion or significant stenosis. SUPERIOR MESENTERIC ARTERY: No acute finding. No occlusion or significant stenosis. INFERIOR MESENTERIC ARTERY: No acute finding. No occlusion or significant stenosis. RENAL ARTERIES: No acute finding. No occlusion or significant stenosis. ILIAC ARTERIES: No acute finding. No occlusion or significant stenosis. CHEST: MEDIASTINUM: No mediastinal lymphadenopathy. The heart and pericardium demonstrate no acute abnormality. LUNGS AND PLEURA: The lungs are without acute process. No focal consolidation or pulmonary edema. No evidence of pleural effusion or pneumothorax. THORACIC BONES AND SOFT TISSUES: No acute bone or soft tissue abnormality. ABDOMEN AND PELVIS: LIVER: The liver is unremarkable. GALLBLADDER AND BILE DUCTS: Gallbladder is unremarkable. No biliary ductal dilatation. SPLEEN: The spleen is unremarkable. PANCREAS: The pancreas is unremarkable. ADRENAL GLANDS: Bilateral  adrenal glands demonstrate no acute abnormality. KIDNEYS, URETERS AND BLADDER: No stones in the kidneys or ureters. No hydronephrosis. No perinephric or periureteral stranding. Urinary bladder is unremarkable. GI AND BOWEL: Stomach and duodenal sweep demonstrate no acute abnormality. There is no bowel obstruction. No abnormal bowel wall thickening or distension. Mild left colonic diverticulosis, without evidence of  diverticulitis. Normal appendix (image 206). REPRODUCTIVE: Uterus and bilateral ovaries are within normal limits. PERITONEUM AND RETROPERITONEUM: No ascites or free air. LYMPH NODES: No lymphadenopathy. ABDOMINAL BONES AND SOFT TISSUES: No acute abnormality of the bones. No acute soft tissue abnormality. IMPRESSION: 1. No evidence of thoracoabdominal aortic aneurysm or dissection. 2. Mild left colonic diverticulosis, without evidence of diverticulitis. 3. Otherwise negative CT chest, abdomen, and pelvis. Electronically signed by: Pinkie Pebbles MD 06/01/2024 02:45 AM EDT RP Workstation: [id number]   DG Chest Portable 1 View Result Date: 06/01/2024 EXAM: 1 VIEW XRAY OF THE CHEST 06/01/2024 01:30:00 AM COMPARISON: 05/16/2020 CLINICAL HISTORY: Patient presents with chest pain. Onset: 1 hr ago; Treatment at home: took pain medication (patient states she is not sure the name) not effective; Endorses headache/nausea, no emesis; History: acid reflux. FINDINGS: LUNGS AND PLEURA: Mild bilateral lower lung scarring/atelectasis. No focal pulmonary opacity. No pulmonary edema. No pleural effusion. No pneumothorax. HEART AND MEDIASTINUM: No acute abnormality of the cardiac and mediastinal silhouettes. BONES AND SOFT TISSUES: No acute osseous abnormality. IMPRESSION: 1. No acute abnormalities. Electronically signed by: Pinkie Pebbles MD 06/01/2024 01:33 AM EDT RP Workstation: [id number]     Procedures   Medications Ordered in the ED  0.9 %  sodium chloride  infusion (0 mLs Intravenous Stopped 06/01/24 0223)   ondansetron  (ZOFRAN ) injection 4 mg (4 mg Intravenous Given 06/01/24 0121)  alum & mag hydroxide-simeth (MAALOX/MYLANTA) 200-200-20 MG/5ML suspension 30 mL (30 mLs Oral Given 06/01/24 0121)  haloperidol  lactate (HALDOL ) injection 2 mg (2 mg Intravenous Given 06/01/24 0153)  famotidine  (PEPCID ) IVPB 20 mg premix (0 mg Intravenous Stopped 06/01/24 0223)  dicyclomine  (BENTYL ) capsule 10 mg (10 mg Oral Given 06/01/24 0150)  iohexol  (OMNIPAQUE ) 350 MG/ML injection 100 mL (100 mLs Intravenous Contrast Given 06/01/24 0223)  hydrALAZINE  (APRESOLINE ) injection 2 mg (2 mg Intravenous Given 06/01/24 0246)  ketorolac  (TORADOL ) 30 MG/ML injection 30 mg (30 mg Intravenous Given 06/01/24 0306)  hydrALAZINE  (APRESOLINE ) injection 5 mg (5 mg Intravenous Given 06/01/24 0326)  amLODipine  (NORVASC ) tablet 5 mg (5 mg Oral Given 06/01/24 0327)  fentaNYL  (SUBLIMAZE ) injection 50 mcg (50 mcg Intravenous Given 06/01/24 0327)                                    Medical Decision Making Burning chest pain post prandial at rest   Amount and/or Complexity of Data Reviewed External Data Reviewed: labs, radiology, ECG and notes.    Details: Previous ED visits reviewed  Labs: ordered.    Details: 2 negative troponins < 15.  Normal white count 7, normal hemoglobin 12.9, normal platelets. Normal sodium 142, normal potassium 3.9, normal creatinine. Negative pregnancy test  Radiology: ordered and independent interpretation performed.    Details: No dissection by me  ECG/medicine tests: ordered and independent interpretation performed. Decision-making details documented in ED Course.  Risk OTC drugs. Prescription drug management. Risk Details: Ruled out for MI based on EKG and 2 negative troponins, HEART score is 1 low risk for MACE.  Ruled out for Dissection, PE and PNA in the ED based on CT.  History is consistent with GERD.  Will restart PPI.  Patient has known HTN but is not on medication.  Will start Norvasc  and refer to PMD  for ongoing testing and treatment.  Stable for discharge with close follow up.  Strict returns.       Final diagnoses:  Primary hypertension  Precordial pain  Gastroesophageal reflux disease, unspecified whether esophagitis present   No signs of systemic illness or infection. The patient is nontoxic-appearing on exam and vital signs are within normal limits.  I have reviewed the triage vital signs and the nursing notes. Pertinent labs & imaging results that were available during my care of the patient were reviewed by me and considered in my medical decision making (see chart for details). After history, exam, and medical workup I feel the patient has been appropriately medically screened and is safe for discharge home. Pertinent diagnoses were discussed with the patient. Patient was given return precautions.  ED Discharge Orders          Ordered    omeprazole  (PRILOSEC) 40 MG capsule  Daily        06/01/24 0243    amLODipine  (NORVASC ) 5 MG tablet  Daily        06/01/24 0243               Bridger Pizzi, MD 06/01/24 [phone]

## 2024-06-01 NOTE — ED Triage Notes (Signed)
 Patient presents with CP. Onset: 1 hr ago Tx at home:  took pain medication ( pt states she is not sure the name) Not effective.  Endorses Headache/ Nausea ,No Emesis Hx: Acid reflux.
